# Patient Record
Sex: Male | Born: 1960 | Race: Black or African American | Hispanic: No | Smoking: Never smoker
Health system: Southern US, Community
[De-identification: ages and names within clinical notes are randomized; demographics above are authoritative.]

## PROBLEM LIST (undated history)

## (undated) HISTORY — PX: TESTICLE SURGERY: SHX794

## (undated) HISTORY — PX: HAND SURGERY: SHX662

---

## 2001-07-17 ENCOUNTER — Emergency Department (HOSPITAL_COMMUNITY): Admission: EM | Admit: 2001-07-17 | Discharge: 2001-07-17 | Payer: Self-pay | Admitting: Emergency Medicine

## 2001-07-28 ENCOUNTER — Emergency Department (HOSPITAL_COMMUNITY): Admission: EM | Admit: 2001-07-28 | Discharge: 2001-07-29 | Payer: Self-pay

## 2002-09-05 ENCOUNTER — Emergency Department (HOSPITAL_COMMUNITY): Admission: EM | Admit: 2002-09-05 | Discharge: 2002-09-05 | Payer: Self-pay | Admitting: Emergency Medicine

## 2002-09-05 ENCOUNTER — Encounter: Payer: Self-pay | Admitting: Emergency Medicine

## 2002-09-07 ENCOUNTER — Emergency Department (HOSPITAL_COMMUNITY): Admission: EM | Admit: 2002-09-07 | Discharge: 2002-09-07 | Payer: Self-pay | Admitting: Emergency Medicine

## 2002-09-14 ENCOUNTER — Ambulatory Visit (HOSPITAL_COMMUNITY): Admission: RE | Admit: 2002-09-14 | Discharge: 2002-09-14 | Payer: Self-pay | Admitting: Orthopaedic Surgery

## 2002-09-14 ENCOUNTER — Encounter: Payer: Self-pay | Admitting: Orthopaedic Surgery

## 2002-10-24 ENCOUNTER — Encounter: Payer: Self-pay | Admitting: Neurosurgery

## 2002-10-24 ENCOUNTER — Encounter: Admission: RE | Admit: 2002-10-24 | Discharge: 2002-10-24 | Payer: Self-pay | Admitting: Neurosurgery

## 2003-12-05 ENCOUNTER — Emergency Department (HOSPITAL_COMMUNITY): Admission: EM | Admit: 2003-12-05 | Discharge: 2003-12-06 | Payer: Self-pay | Admitting: Emergency Medicine

## 2003-12-09 ENCOUNTER — Emergency Department (HOSPITAL_COMMUNITY): Admission: EM | Admit: 2003-12-09 | Discharge: 2003-12-10 | Payer: Self-pay | Admitting: Emergency Medicine

## 2004-01-15 ENCOUNTER — Emergency Department (HOSPITAL_COMMUNITY): Admission: EM | Admit: 2004-01-15 | Discharge: 2004-01-15 | Payer: Self-pay | Admitting: Emergency Medicine

## 2004-01-21 ENCOUNTER — Emergency Department (HOSPITAL_COMMUNITY): Admission: EM | Admit: 2004-01-21 | Discharge: 2004-01-21 | Payer: Self-pay | Admitting: Emergency Medicine

## 2011-06-29 ENCOUNTER — Encounter (HOSPITAL_COMMUNITY): Payer: Self-pay

## 2011-06-29 ENCOUNTER — Emergency Department (HOSPITAL_COMMUNITY)
Admission: EM | Admit: 2011-06-29 | Discharge: 2011-06-29 | Disposition: A | Discharge status: Home / Self Care | Payer: BC Managed Care – PPO | Attending: Emergency Medicine | Admitting: Emergency Medicine

## 2011-06-29 DIAGNOSIS — R300 Dysuria: Secondary | ICD-10-CM | POA: Insufficient documentation

## 2011-06-29 LAB — URINALYSIS, ROUTINE W REFLEX MICROSCOPIC
Bilirubin Urine: NEGATIVE
Glucose, UA: NEGATIVE mg/dL
Ketones, ur: NEGATIVE mg/dL
Protein, ur: NEGATIVE mg/dL
Urobilinogen, UA: 0.2 mg/dL (ref 0.0–1.0)

## 2011-06-29 MED ORDER — METHOCARBAMOL 500 MG PO TABS
250.0000 mg | ORAL_TABLET | Freq: Once | ORAL | Status: AC
Start: 2011-06-29 — End: 2011-06-29
  Administered 2011-06-29: 250 mg via ORAL
  Filled 2011-06-29: qty 2

## 2011-06-29 MED ORDER — AZITHROMYCIN 250 MG PO TABS
1000.0000 mg | ORAL_TABLET | Freq: Once | ORAL | Status: AC
  Administered 2011-06-29: 1000 mg via ORAL
  Filled 2011-06-29: qty 4

## 2011-06-29 NOTE — Discharge Instructions (Signed)
Please increase fluids. Your results should be available in 3 to 4 days. Please refrain from sexual activity for the next 7 days.

## 2011-06-29 NOTE — ED Notes (Signed)
Pt reports had intercourse 9 days ago and reports since SUnday has noticed some "tingling" with urination and sweating in groin area.  Denies any rash

## 2011-07-14 NOTE — ED Provider Notes (Signed)
History     CSN: [id number]  Arrival date & time 06/29/11  1319   First MD Initiated Contact with Patient 06/29/11 1441      Chief Complaint  Patient presents with  . SEXUALLY TRANSMITTED DISEASE    (Consider location/radiation/quality/duration/timing/severity/associated sxs/prior treatment) Patient is a 51 y.o. male presenting with dysuria. The history is provided by the patient.  Dysuria  This is a new problem. The current episode started more than 1 week ago. The problem occurs intermittently. The problem has not changed since onset.The quality of the pain is described as burning (tingling). The pain is moderate. There has been no fever. He is sexually active. There is no history of pyelonephritis. Pertinent negatives include no chills, no nausea, no vomiting, no discharge, no frequency, no hematuria and no flank pain. He has tried nothing for the symptoms. His past medical history does not include kidney stones, single kidney, recurrent UTIs or catheterization.    History reviewed. No pertinent past medical history.  Past Surgical History  Procedure Date  . Hand surgery     No family history on file.  History  Substance Use Topics  . Smoking status: Never Smoker   . Smokeless tobacco: Not on file  . Alcohol Use: No      Review of Systems  Constitutional: Negative for chills and activity change.       All ROS Neg except as noted in HPI  HENT: Negative for nosebleeds and neck pain.   Eyes: Negative for photophobia and discharge.  Respiratory: Negative for cough, shortness of breath and wheezing.   Cardiovascular: Negative for chest pain and palpitations.  Gastrointestinal: Negative for nausea, vomiting, abdominal pain and blood in stool.  Genitourinary: Positive for dysuria. Negative for frequency, hematuria and flank pain.  Musculoskeletal: Negative for back pain and arthralgias.  Skin: Negative.   Neurological: Negative for dizziness, seizures and speech  difficulty.  Psychiatric/Behavioral: Negative for hallucinations and confusion.    Allergies  Review of patient's allergies indicates no known allergies.  Home Medications   Current Outpatient Rx  Name Route Sig Dispense Refill  . ADULT MULTIVITAMIN W/MINERALS CH Oral Take 1 tablet by mouth daily.      BP 151/94  Pulse 80  Temp(Src) 97.6 F (36.4 C) (Oral)  Resp 20  Ht 6' (1.829 m)  Wt 200 lb (90.719 kg)  BMI 27.12 kg/m2  SpO2 100%  Physical Exam  Nursing note and vitals reviewed. Constitutional: He is oriented to person, place, and time. He appears well-developed and well-nourished.  Non-toxic appearance.  HENT:  Head: Normocephalic.  Right Ear: Tympanic membrane and external ear normal.  Left Ear: Tympanic membrane and external ear normal.  Eyes: EOM and lids are normal. Pupils are equal, round, and reactive to light.  Neck: Normal range of motion. Neck supple. Carotid bruit is not present.  Cardiovascular: Normal rate, regular rhythm, normal heart sounds, intact distal pulses and normal pulses.   Pulmonary/Chest: Breath sounds normal. No respiratory distress.  Abdominal: Soft. Bowel sounds are normal. There is no tenderness. There is no guarding.  Genitourinary: Penis normal. No penile tenderness.       No rash of the penis. No significant discharge.  Musculoskeletal: Normal range of motion.  Lymphadenopathy:       Head (right side): No submandibular adenopathy present.       Head (left side): No submandibular adenopathy present.    He has no cervical adenopathy.  Neurological: He is alert and oriented to person,  place, and time. He has normal strength. No cranial nerve deficit or sensory deficit.  Skin: Skin is warm and dry.  Psychiatric: He has a normal mood and affect. His speech is normal.    ED Course  Procedures (including critical care time)   Labs Reviewed  URINALYSIS, ROUTINE W REFLEX MICROSCOPIC  GC/CHLAMYDIA PROBE AMP, GENITAL  LAB REPORT - SCANNED    No results found.   1. Dysuria       MDM  I have reviewed nursing notes, vital signs, and all appropriate lab and imaging results for this patient. Urethral culture obtained. Pt treated with zithromax and rocephin. Pt to have additional testing at [facility]       Kathie Dike, [place] 07/14/11 1055

## 2011-07-14 NOTE — ED Provider Notes (Signed)
Medical screening examination/treatment/procedure(s) were performed by non-physician practitioner and as supervising physician I was immediately available for consultation/collaboration.   Carleene Cooper III, MD 07/14/11 [phone]

## 2016-09-02 ENCOUNTER — Encounter (HOSPITAL_COMMUNITY): Payer: Self-pay | Admitting: Emergency Medicine

## 2016-09-02 ENCOUNTER — Emergency Department (HOSPITAL_COMMUNITY)
Admission: EM | Admit: 2016-09-02 | Discharge: 2016-09-02 | Disposition: A | Discharge status: Home / Self Care | Payer: BLUE CROSS/BLUE SHIELD | Attending: Emergency Medicine | Admitting: Emergency Medicine

## 2016-09-02 DIAGNOSIS — Z79899 Other long term (current) drug therapy: Secondary | ICD-10-CM | POA: Diagnosis not present

## 2016-09-02 DIAGNOSIS — R3129 Other microscopic hematuria: Secondary | ICD-10-CM

## 2016-09-02 DIAGNOSIS — R319 Hematuria, unspecified: Secondary | ICD-10-CM | POA: Diagnosis not present

## 2016-09-02 DIAGNOSIS — R300 Dysuria: Secondary | ICD-10-CM | POA: Diagnosis present

## 2016-09-02 DIAGNOSIS — Z791 Long term (current) use of non-steroidal anti-inflammatories (NSAID): Secondary | ICD-10-CM | POA: Diagnosis not present

## 2016-09-02 LAB — URINALYSIS, ROUTINE W REFLEX MICROSCOPIC
Bilirubin Urine: NEGATIVE
Glucose, UA: NEGATIVE mg/dL
Ketones, ur: NEGATIVE mg/dL
NITRITE: NEGATIVE
PH: 5 (ref 5.0–8.0)
PROTEIN: NEGATIVE mg/dL
SPECIFIC GRAVITY, URINE: 1.016 (ref 1.005–1.030)

## 2016-09-02 NOTE — ED Provider Notes (Signed)
[facility]PT Provider Note   CSN: [id number] Arrival date & time: 09/02/16  1935     History   Chief Complaint Chief Complaint  Patient presents with  . Dysuria    HPI Drew Wheeler is a 56 y.o. male.  HPI Patient presents after one day of dysuria, dark in urine. Patient is generally well, denies medical problems, states that he has not seen a doctor in a very long time. Since about 36 hours ago the patient has had mild discomfort at the very end of urination, as well as dark urine. There have been no concurrent changes including fever, chills, nausea, vomiting, diarrhea, abdominal pain, scrotal swelling, penis pain. Patient did have unprotected intercourse about 9 days ago, though he notes that he has a single partner, unchanged. Today, during the last urination the patient had no symptoms, notes that his urine has cleared.   History reviewed. No pertinent past medical history.  There are no active problems to display for this patient.   Past Surgical History:  Procedure Laterality Date  . HAND SURGERY    . TESTICLE SURGERY         Home Medications    Prior to Admission medications   Medication Sig Start Date End Date Taking? Authorizing Provider  Cyanocobalamin (B-12 PO) Take 1 tablet by mouth daily.   Yes [provider]  GINSENG PO Take 1 tablet by mouth daily.   Yes [provider]  ibuprofen (ADVIL,MOTRIN) 200 MG tablet Take 800 mg by mouth once as needed for headache.   Yes [provider]  pyridOXINE (VITAMIN B-6) 100 MG tablet Take 100 mg by mouth daily.   Yes [provider]  Thiamine HCl (B-1 PO) Take 1 tablet by mouth daily.   Yes [provider]    Family History History reviewed. No pertinent family history.  Social History Social History  Substance Use Topics  . Smoking status: Never Smoker  . Smokeless tobacco: Never Used  . Alcohol use No     Allergies   Patient has no known  allergies.   Review of Systems Review of Systems  Constitutional:       Per HPI, otherwise negative  HENT:       Per HPI, otherwise negative  Respiratory:       Per HPI, otherwise negative  Cardiovascular:       Per HPI, otherwise negative  Gastrointestinal: Negative for vomiting.  Endocrine:       Negative aside from HPI  Genitourinary:       Neg aside from HPI   Musculoskeletal:       Per HPI, otherwise negative  Skin: Negative.   Neurological: Negative for syncope.     Physical Exam Updated Vital Signs BP (!) 141/86 (BP Location: Right Arm)   Pulse (!) 108   Temp 98.6 F (37 C) (Oral)   Resp 20   Ht 5\' 11"  (1.803 m)   Wt 88.5 kg (195 lb)   SpO2 97%   BMI 27.20 kg/m   Physical Exam  Constitutional: He is oriented to person, place, and time. He appears well-developed. No distress.  HENT:  Head: Normocephalic and atraumatic.  Eyes: Conjunctivae and EOM are normal.  Cardiovascular: Normal rate and regular rhythm.   Pulmonary/Chest: Effort normal. No stridor. No respiratory distress.  Abdominal: He exhibits no distension and no mass. There is no tenderness. There is no guarding.  Musculoskeletal: He exhibits no edema.  Neurological: He is alert and  oriented to person, place, and time.  Skin: Skin is warm and dry.  Psychiatric: He has a normal mood and affect.  Nursing note and vitals reviewed.    ED Treatments / Results  Labs (all labs ordered are listed, but only abnormal results are displayed) Labs Reviewed  URINALYSIS, ROUTINE W REFLEX MICROSCOPIC - Abnormal; Notable for the following:       Result Value   APPearance HAZY (*)    Hgb urine dipstick LARGE (*)    Leukocytes, UA SMALL (*)    Bacteria, UA RARE (*)    Squamous Epithelial / LPF 0-5 (*)    All other components within normal limits     Procedures Procedures (including critical care time)   Initial Impression / Assessment and Plan / ED Course  I have reviewed the triage vital signs and  the nursing notes.  Pertinent labs & imaging results that were available during my care of the patient were reviewed by me and considered in my medical decision making (see chart for details).  Update: On repeat exam the patient is in no distress.  He has had additional episode of urination, again with no pain, no discoloration, and remains asymptomatic. Plan a lengthy conversation about findings, the possibility of urethritis versus kidney stone, absence of evidence for infection. With no ongoing complaints, no evidence for bacteremia, sepsis, no scrotal pain, there is low suspicion for bacteremia, sepsis, and after a conversation on return precautions, follow-up instructions, the patient was discharged in stable condition.  Final Clinical Impressions(s) / ED Diagnoses   Final diagnoses:  Dysuria  Hematuria, microscopic     Gerhard MunchLockwood, Joletta Manner, MD 09/02/16 2134

## 2016-09-02 NOTE — ED Triage Notes (Signed)
Pt states he is having sharp pains with urination

## 2016-09-02 NOTE — Discharge Instructions (Signed)
As discussed, today's evaluation has been generally reassuring. With your discomfort with urination, and evidence of blood in urine, there is suspicion that you passed a kidney stone. It is important that you monitor your condition carefully, stay well-hydrated, and do not hesitate to return here for concerning changes in your condition.

## 2016-10-26 DIAGNOSIS — R972 Elevated prostate specific antigen [PSA]: Secondary | ICD-10-CM | POA: Insufficient documentation

## 2016-12-12 ENCOUNTER — Emergency Department (HOSPITAL_COMMUNITY)
Admission: EM | Admit: 2016-12-12 | Discharge: 2016-12-12 | Disposition: A | Discharge status: Home / Self Care | Payer: BLUE CROSS/BLUE SHIELD | Attending: Emergency Medicine | Admitting: Emergency Medicine

## 2016-12-12 ENCOUNTER — Encounter (HOSPITAL_COMMUNITY): Payer: Self-pay

## 2016-12-12 ENCOUNTER — Emergency Department (HOSPITAL_COMMUNITY): Payer: BLUE CROSS/BLUE SHIELD

## 2016-12-12 DIAGNOSIS — S62521A Displaced fracture of distal phalanx of right thumb, initial encounter for closed fracture: Secondary | ICD-10-CM | POA: Diagnosis not present

## 2016-12-12 DIAGNOSIS — Y939 Activity, unspecified: Secondary | ICD-10-CM | POA: Insufficient documentation

## 2016-12-12 DIAGNOSIS — Y999 Unspecified external cause status: Secondary | ICD-10-CM | POA: Insufficient documentation

## 2016-12-12 DIAGNOSIS — S6991XA Unspecified injury of right wrist, hand and finger(s), initial encounter: Secondary | ICD-10-CM | POA: Diagnosis present

## 2016-12-12 DIAGNOSIS — Y929 Unspecified place or not applicable: Secondary | ICD-10-CM | POA: Diagnosis not present

## 2016-12-12 DIAGNOSIS — S62501A Fracture of unspecified phalanx of right thumb, initial encounter for closed fracture: Secondary | ICD-10-CM

## 2016-12-12 DIAGNOSIS — X58XXXA Exposure to other specified factors, initial encounter: Secondary | ICD-10-CM | POA: Diagnosis not present

## 2016-12-12 MED ORDER — TRAMADOL HCL 50 MG PO TABS: 50.0000 mg | ORAL_TABLET | Freq: Four times a day (QID) | ORAL | 0 refills | Status: DC | PRN

## 2016-12-12 NOTE — ED Provider Notes (Signed)
[facility] Provider Note   CSN: [id number] Arrival date & time: 12/12/16  1140     History   Chief Complaint Chief Complaint  Patient presents with  . Hand Pain    HPI Drew Wheeler is a 56 y.o. male.  Patient states he injured his right thumb couple weeks ago then reinjected a couple days ago.   The history is provided by the patient.  Hand Injury   The incident occurred 2 days ago. The incident occurred at home. The injury mechanism was a direct blow. Pain location: Right thumb. The quality of the pain is described as sharp. The pain is at a severity of 3/10. The pain is moderate. The pain has been intermittent since the incident. Pertinent negatives include no fever.    History reviewed. No pertinent past medical history.  There are no active problems to display for this patient.   Past Surgical History:  Procedure Laterality Date  . HAND SURGERY    . TESTICLE SURGERY         Home Medications    Prior to Admission medications   Medication Sig Start Date End Date Taking? Authorizing Provider  Cyanocobalamin (B-12 PO) Take 1 tablet by mouth daily.    [provider]  GINSENG PO Take 1 tablet by mouth daily.    [provider]  ibuprofen (ADVIL,MOTRIN) 200 MG tablet Take 800 mg by mouth once as needed for headache.    [provider]  pyridOXINE (VITAMIN B-6) 100 MG tablet Take 100 mg by mouth daily.    [provider]  Thiamine HCl (B-1 PO) Take 1 tablet by mouth daily.    [provider]    Family History No family history on file.  Social History Social History  Substance Use Topics  . Smoking status: Never Smoker  . Smokeless tobacco: Never Used  . Alcohol use No     Allergies   Patient has no known allergies.   Review of Systems Review of Systems  Constitutional: Negative for appetite change, fatigue and fever.  HENT: Negative for congestion, ear discharge and sinus pressure.   Eyes:  Negative for discharge.  Respiratory: Negative for cough.   Cardiovascular: Negative for chest pain.  Gastrointestinal: Negative for abdominal pain and diarrhea.  Genitourinary: Negative for frequency and hematuria.  Musculoskeletal: Negative for back pain.       Right thumb pain  Skin: Negative for rash.  Neurological: Negative for seizures and headaches.  Psychiatric/Behavioral: Negative for hallucinations.     Physical Exam Updated Vital Signs BP 130/78 (BP Location: Right Arm)   Pulse 73   Temp 98.3 F (36.8 C) (Oral)   Resp 16   Ht  (1.803 m)   Wt 89.4 kg (197 lb)   SpO2 96%   BMI 27.48 kg/m   Physical Exam  Constitutional: He is oriented to person, place, and time. He appears well-developed.  HENT:  Head: Normocephalic.  Eyes: Conjunctivae are normal.  Neck: No tracheal deviation present.  Cardiovascular:  No murmur heard. Musculoskeletal: Normal range of motion.  Tender swollen distal right thumb  Neurological: He is oriented to person, place, and time.  Skin: Skin is warm.  Psychiatric: He has a normal mood and affect.     ED Treatments / Results  Labs (all labs ordered are listed, but only abnormal results are displayed) Labs Reviewed - No data to [id number]  EKG  EKG Interpretation None       Radiology  Dg Finger Thumb Right  Result Date: 12/12/2016 CLINICAL DATA:  Blunt trauma to the first digit with pain and swelling, initial encounter EXAM: RIGHT THUMB 2+V COMPARISON:  None. FINDINGS: Minimally displaced fracture through the first distal phalanx is noted. No other fracture or soft tissue abnormality is noted. IMPRESSION: First distal phalangeal fracture. Electronically Signed   By: Alcide Clever M.D.   On: 12/12/2016 12:13    Procedures Procedures (including critical care time)  Medications Ordered in ED Medications - No data to display   Initial Impression / Assessment and Plan / ED Course  I have reviewed the triage vital signs and  the nursing notes.  Pertinent labs & imaging results that were available during my care of the patient were reviewed by me and considered in my medical decision making (see chart for details).   patient with fracture of distal phalanx of right thumb. Patient will be splinted and referred to orthopedics    Final Clinical Impressions(s) / ED Diagnoses   Final diagnoses:  Closed nondisplaced fracture of phalanx of right thumb, unspecified phalanx, initial encounter    New Prescriptions New Prescriptions   No medications on file     Bethann Berkshire, MD 12/12/16 1256

## 2016-12-12 NOTE — ED Triage Notes (Signed)
Reports of metal bar fell onto right thumb over 1 week ago. States he jammed finger again and now having increased pain and swelling.

## 2016-12-12 NOTE — Discharge Instructions (Signed)
Follow up with Dr. Romeo Apple in [place] this week.  Or follow up with dr. Jena Gauss in [place]

## 2016-12-12 NOTE — ED Notes (Signed)
Splint applied to right thumb

## 2017-06-03 ENCOUNTER — Other Ambulatory Visit: Payer: Self-pay

## 2017-06-03 ENCOUNTER — Emergency Department (HOSPITAL_COMMUNITY)
Admission: EM | Admit: 2017-06-03 | Discharge: 2017-06-03 | Disposition: A | Discharge status: Home / Self Care | Payer: BLUE CROSS/BLUE SHIELD | Attending: Emergency Medicine | Admitting: Emergency Medicine

## 2017-06-03 DIAGNOSIS — N3000 Acute cystitis without hematuria: Secondary | ICD-10-CM | POA: Diagnosis not present

## 2017-06-03 DIAGNOSIS — Z79899 Other long term (current) drug therapy: Secondary | ICD-10-CM | POA: Insufficient documentation

## 2017-06-03 DIAGNOSIS — R300 Dysuria: Secondary | ICD-10-CM | POA: Diagnosis present

## 2017-06-03 LAB — URINALYSIS, ROUTINE W REFLEX MICROSCOPIC
Bilirubin Urine: NEGATIVE
GLUCOSE, UA: NEGATIVE mg/dL
Ketones, ur: NEGATIVE mg/dL
Nitrite: NEGATIVE
Protein, ur: NEGATIVE mg/dL
SPECIFIC GRAVITY, URINE: 1.004 — AB (ref 1.005–1.030)
pH: 7 (ref 5.0–8.0)

## 2017-06-03 MED ORDER — CIPROFLOXACIN HCL 500 MG PO TABS: 500.0000 mg | ORAL_TABLET | Freq: Two times a day (BID) | ORAL | 0 refills | Status: DC

## 2017-06-03 MED ORDER — CIPROFLOXACIN HCL 250 MG PO TABS
500.0000 mg | ORAL_TABLET | Freq: Once | ORAL | Status: AC
  Administered 2017-06-03: 500 mg via ORAL
  Filled 2017-06-03: qty 2

## 2017-06-03 NOTE — ED Provider Notes (Signed)
[facility] EM[facility]GENCY DEPARTMENT Provider Note   CSN: [id number] Arrival date & time: 06/03/17  0033     History   Chief Complaint Chief Complaint  Patient presents with  . Testicle Pain    HPI Rosalio MacadamiaJohn E Warbington is a 57 y.o. male.  Patient is a 57 year old male with no significant past medical history.  He presents today for evaluation of burning on urination.  He states that he feels a slight burning when he begins his urine stream, however this improves as urination continues.  He denies any fevers or chills.  He denies to me he is experiencing any testicular pain or swelling.  He is sexually active with an on again, off again partner, most recently 4 days ago.  He denies that she is experiencing any similar issues.   The history is provided by the patient.  Dysuria   This is a new problem. The current episode started 2 days ago. The problem occurs every urination. The problem has not changed since onset.The quality of the pain is described as burning. The pain is mild. There has been no fever. Pertinent negatives include no chills, no discharge, no hematuria, no hesitancy, no urgency and no flank pain. He has tried nothing for the symptoms.    No past medical history on file.  There are no active problems to display for this patient.   Past Surgical History:  Procedure Laterality Date  . HAND SURGERY    . TESTICLE SURGERY         Home Medications    Prior to Admission medications   Medication Sig Start Date End Date Taking? Authorizing Provider  Cyanocobalamin (B-12 PO) Take 1 tablet by mouth daily.    [provider]  GINSENG PO Take 1 tablet by mouth daily.    [provider]  ibuprofen (ADVIL,MOTRIN) 200 MG tablet Take 800 mg by mouth once as needed for headache.    [provider]  pyridOXINE (VITAMIN B-6) 100 MG tablet Take 100 mg by mouth daily.    [provider]  Thiamine HCl (B-1 PO) Take 1 tablet by mouth daily.     [provider]  traMADol (ULTRAM) 50 MG tablet Take 1 tablet (50 mg total) by mouth every 6 (six) hours as needed. 12/12/16   Bethann BerkshireZammit, Joseph, MD    Family History No family history on file.  Social History Social History   Tobacco Use  . Smoking status: Never Smoker  . Smokeless tobacco: Never Used  Substance Use Topics  . Alcohol use: No  . Drug use: No     Allergies   Patient has no known allergies.   Review of Systems Review of Systems  Constitutional: Negative for chills.  Genitourinary: Positive for dysuria. Negative for flank pain, hematuria, hesitancy and urgency.  All other systems reviewed and are negative.    Physical Exam Updated Vital Signs BP (!) 143/106 (BP Location: Right Arm)   Pulse 70   Temp 97.8 F (36.6 C) (Oral)   Resp 18   Ht 5\' 11"  (1.803 m)   Wt 89.4 kg (197 lb)   SpO2 99%   BMI 27.48 kg/m   Physical Exam  Constitutional: He is oriented to person, place, and time. He appears well-developed and well-nourished. No distress.  HENT:  Head: Normocephalic and atraumatic.  Neck: Normal range of motion. Neck supple.  Abdominal: Soft. He exhibits no distension. There is no tenderness.  Genitourinary:  Genitourinary Comments: External genitalia is normal  in appearance.  There is no swelling.  Testicles are freely mobile.  There is no discharge.  Neurological: He is alert and oriented to person, place, and time.  Skin: Skin is warm and dry. He is not diaphoretic.  Nursing note and vitals reviewed.    ED Treatments / Results  Labs (all labs ordered are listed, but only abnormal results are displayed) Labs Reviewed  URINALYSIS, ROUTINE W REFLEX MICROSCOPIC - Abnormal; Notable for the following components:      Result Value   Color, Urine STRAW (*)    Specific Gravity, Urine 1.004 (*)    Hgb urine dipstick SMALL (*)    Leukocytes, UA MODERATE (*)    Bacteria, UA RARE (*)    Squamous Epithelial / LPF 0-5 (*)    All other  components within normal limits  GC/CHLAMYDIA PROBE AMP ([place]) NOT AT [facility]    EKG  EKG Interpretation None       Radiology No results found.  Procedures Procedures (including critical care time)  Medications Ordered in ED Medications  ciprofloxacin (CIPRO) tablet 500 mg (not administered)     Initial Impression / Assessment and Plan / ED Course  I have reviewed the triage vital signs and the nursing notes.  Pertinent labs & imaging results that were available during my care of the patient were reviewed by me and considered in my medical decision making (see chart for details).  UA suggestive of UTI. Will treat with Cipro, return prn.  A GC chlamydia test will be added to the urine, however I doubt this to be the case.  He is having no discharge and dysuria is minimal.  Will wait on cultures.  Final Clinical Impressions(s) / ED Diagnoses   Final diagnoses:  None    ED Discharge Orders    None       Geoffery Lyons, MD 06/03/17 [phone]

## 2017-06-03 NOTE — Discharge Instructions (Signed)
Cipro as prescribed.  We will call you if your cultures indicate you require further treatment or action.  Return to the emergency department if symptoms significantly worsen or change.

## 2017-06-03 NOTE — ED Triage Notes (Addendum)
Pt reports pain in both testicles since this afternoon, states also having some discomfort with urination, no noted swelling.  Sexually active with intermittent partner 4 days ago

## 2017-06-06 LAB — GC/CHLAMYDIA PROBE AMP (~~LOC~~) NOT AT ARMC
Chlamydia: NEGATIVE
Neisseria Gonorrhea: NEGATIVE

## 2017-10-11 ENCOUNTER — Emergency Department (HOSPITAL_COMMUNITY)
Admission: EM | Admit: 2017-10-11 | Discharge: 2017-10-11 | Disposition: A | Discharge status: Home / Self Care | Payer: BLUE CROSS/BLUE SHIELD | Attending: Emergency Medicine | Admitting: Emergency Medicine

## 2017-10-11 ENCOUNTER — Emergency Department (HOSPITAL_COMMUNITY): Payer: BLUE CROSS/BLUE SHIELD

## 2017-10-11 ENCOUNTER — Encounter (HOSPITAL_COMMUNITY): Payer: Self-pay | Admitting: Emergency Medicine

## 2017-10-11 ENCOUNTER — Other Ambulatory Visit: Payer: Self-pay

## 2017-10-11 DIAGNOSIS — L02512 Cutaneous abscess of left hand: Secondary | ICD-10-CM | POA: Diagnosis not present

## 2017-10-11 DIAGNOSIS — Z79899 Other long term (current) drug therapy: Secondary | ICD-10-CM | POA: Diagnosis not present

## 2017-10-11 DIAGNOSIS — M79642 Pain in left hand: Secondary | ICD-10-CM | POA: Diagnosis present

## 2017-10-11 MED ORDER — BUPIVACAINE HCL (PF) 0.5 % IJ SOLN
10.0000 mL | Freq: Once | INTRAMUSCULAR | Status: AC
  Administered 2017-10-11: 10 mL
  Filled 2017-10-11: qty 30

## 2017-10-11 MED ORDER — SULFAMETHOXAZOLE-TRIMETHOPRIM 800-160 MG PO TABS: 1.0000 | ORAL_TABLET | Freq: Two times a day (BID) | ORAL | 0 refills | Status: AC

## 2017-10-11 MED ORDER — HYDROCODONE-ACETAMINOPHEN 5-325 MG PO TABS: 1.0000 | ORAL_TABLET | ORAL | 0 refills | Status: DC | PRN

## 2017-10-11 MED ORDER — SULFAMETHOXAZOLE-TRIMETHOPRIM 800-160 MG PO TABS
1.0000 | ORAL_TABLET | Freq: Once | ORAL | Status: AC
  Administered 2017-10-11: 1 via ORAL
  Filled 2017-10-11: qty 1

## 2017-10-11 NOTE — ED Provider Notes (Signed)
[facility] EMERGENCY DEPARTMENT Provider Note   CSN: [id number] Arrival date & time: 10/11/17  1944     History   Chief Complaint Chief Complaint  Patient presents with  . Hand Pain    HPI Drew Wheeler is a 57 y.o. male.  Pt presents to the ED today with left hand pain and swelling.  The pt said he cut his left finger while at work about 4 days ago.  He cleaned it out and noticed some swelling yesterday.  He went to the ED in [place] and was put on Augmentin.  He has started taking that medication, but swelling and redness is worsening.     History reviewed. No pertinent past medical history.  There are no active problems to display for this patient.   Past Surgical History:  Procedure Laterality Date  . HAND SURGERY    . TESTICLE SURGERY          Home Medications    Prior to Admission medications   Medication Sig Start Date End Date Taking? Authorizing Provider  amoxicillin-clavulanate (AUGMENTIN) 875-125 MG tablet Take 1 tablet by mouth 2 (two) times daily. 10 day course starting on 10/11/2017   Yes [provider]  Cyanocobalamin (B-12 PO) Take 1 tablet by mouth daily.   Yes [provider]  GINSENG PO Take 1 tablet by mouth daily.   Yes [provider]  ibuprofen (ADVIL,MOTRIN) 200 MG tablet Take 800 mg by mouth daily as needed for headache or moderate pain.    Yes [provider]  pyridOXINE (VITAMIN B-6) 100 MG tablet Take 100 mg by mouth daily.   Yes [provider]  Thiamine HCl (B-1 PO) Take 1 tablet by mouth daily.   Yes [provider]  HYDROcodone-acetaminophen (NORCO/VICODIN) 5-325 MG tablet Take 1 tablet by mouth every 4 (four) hours as needed. 10/11/17   Jacalyn Lefevre, MD  sulfamethoxazole-trimethoprim (BACTRIM DS,SEPTRA DS) 800-160 MG tablet Take 1 tablet by mouth 2 (two) times daily for 7 days. 10/11/17 10/18/17  Jacalyn Lefevre, MD    Family History No family history on file.  Social  History Social History   Tobacco Use  . Smoking status: Never Smoker  . Smokeless tobacco: Never Used  Substance Use Topics  . Alcohol use: No  . Drug use: No     Allergies   Patient has no known allergies.   Review of Systems Review of Systems  Musculoskeletal:       Left hand pain  All other systems reviewed and are negative.    Physical Exam Updated Vital Signs BP (!) 155/94 (BP Location: Right Arm)   Pulse 85   Temp 97.8 F (36.6 C) (Oral)   Resp 16   Ht 5\' 11"  (1.803 m)   Wt 89.8 kg (198 lb)   SpO2 96%   BMI 27.62 kg/m   Physical Exam  Constitutional: He is oriented to person, place, and time. He appears well-developed and well-nourished.  HENT:  Head: Normocephalic and atraumatic.  Right Ear: External ear normal.  Left Ear: External ear normal.  Nose: Nose normal.  Mouth/Throat: Oropharynx is clear and moist.  Eyes: Pupils are equal, round, and reactive to light. Conjunctivae and EOM are normal.  Neck: Normal range of motion. Neck supple.  Cardiovascular: Normal rate, regular rhythm, normal heart sounds and intact distal pulses.  Pulmonary/Chest: Effort normal and breath sounds normal.  Abdominal: Soft. Bowel sounds are normal.  Musculoskeletal:  Left index finger with swelling and  abscess.  Cellulitis spreading down finger.  Neurological: He is alert and oriented to person, place, and time.  Skin: Skin is warm. Capillary refill takes less than 2 seconds.  Psychiatric: He has a normal mood and affect. His behavior is normal. Judgment and thought content normal.  Nursing note and vitals reviewed.    ED Treatments / Results  Labs (all labs ordered are listed, but only abnormal results are displayed) Labs Reviewed - No data to display  EKG None  Radiology Dg Hand Complete Left  Result Date: 10/11/2017 CLINICAL DATA:  Pain and swelling. Laceration index finger 4 days ago. EXAM: LEFT HAND - COMPLETE 3+ VIEW COMPARISON:  None. FINDINGS: Soft  tissue swelling of the index finger. No fracture or foreign body. Negative for arthropathy. IMPRESSION: Soft tissue swelling of the index finger. Negative for fracture or foreign body. Electronically Signed   By: Marlan Palauharles  Clark M.D.   On: 10/11/2017 20:27    Procedures .Marland Kitchen.Incision and Drainage Date/Time: 10/11/2017 9:30 PM Performed by: Jacalyn LefevreHaviland, Mikyah Alamo, MD Authorized by: Jacalyn LefevreHaviland, Gwenette Wellons, MD   Consent:    Consent obtained:  Verbal   Consent given by:  Patient   Risks discussed:  Bleeding, incomplete drainage and pain   Alternatives discussed:  No treatment Location:    Type:  Abscess   Size:  1   Location:  Upper extremity   Upper extremity location:  Finger   Finger location:  L index finger Pre-procedure details:    Skin preparation:  Chloraprep Anesthesia (see MAR for exact dosages):    Anesthesia method:  Nerve block   Block needle gauge:  25 G   Block anesthetic:  Bupivacaine 0.5% w/o epi   Block technique:  Digital   Block injection procedure:  Anatomic landmarks identified, introduced needle, incremental injection, anatomic landmarks palpated and negative aspiration for blood   Block outcome:  Anesthesia achieved Procedure type:    Complexity:  Simple Procedure details:    Needle aspiration: no     Incision types:  Cruciate   Scalpel blade:  11   Drainage:  Purulent   Drainage amount:  Moderate   Wound treatment:  Wound left open   Packing materials:  None Post-procedure details:    Patient tolerance of procedure:  Tolerated well, no immediate complications   (including critical care time)  Medications Ordered in ED Medications  bupivacaine (MARCAINE) 0.5 % injection 10 mL (10 mLs Infiltration Given by Other 10/11/17 2105)  sulfamethoxazole-trimethoprim (BACTRIM DS,SEPTRA DS) 800-160 MG per tablet 1 tablet (1 tablet Oral Given 10/11/17 2104)     Initial Impression / Assessment and Plan / ED Course  I have reviewed the triage vital signs and the nursing  notes.  Pertinent labs & imaging results that were available during my care of the patient were reviewed by me and considered in my medical decision making (see chart for details).     Pt likely has staph, so I changed abx to bactrim.  Pt told to return if sx worsen and to f/u with hand.  Final Clinical Impressions(s) / ED Diagnoses   Final diagnoses:  Abscess of finger of left hand    ED Discharge Orders        Ordered    sulfamethoxazole-trimethoprim (BACTRIM DS,SEPTRA DS) 800-160 MG tablet  2 times daily     10/11/17 2129    HYDROcodone-acetaminophen (NORCO/VICODIN) 5-325 MG tablet  Every 4 hours PRN     10/11/17 2129       Jacalyn LefevreHaviland, Gali Spinney,  MD 10/11/17 2131

## 2017-10-11 NOTE — ED Triage Notes (Signed)
Pt c/o left hand swelling since yesterday. Pt states he had a cut to the left index finger x 4 days ago.

## 2019-08-26 ENCOUNTER — Other Ambulatory Visit: Payer: Self-pay

## 2019-08-26 ENCOUNTER — Encounter (HOSPITAL_COMMUNITY): Payer: Self-pay | Admitting: Emergency Medicine

## 2019-08-26 ENCOUNTER — Emergency Department (HOSPITAL_COMMUNITY)
Admission: EM | Admit: 2019-08-26 | Discharge: 2019-08-26 | Disposition: A | Discharge status: Home / Self Care | Payer: BC Managed Care – PPO | Attending: Emergency Medicine | Admitting: Emergency Medicine

## 2019-08-26 DIAGNOSIS — R319 Hematuria, unspecified: Secondary | ICD-10-CM | POA: Insufficient documentation

## 2019-08-26 DIAGNOSIS — Z79899 Other long term (current) drug therapy: Secondary | ICD-10-CM | POA: Insufficient documentation

## 2019-08-26 DIAGNOSIS — Z202 Contact with and (suspected) exposure to infections with a predominantly sexual mode of transmission: Secondary | ICD-10-CM | POA: Diagnosis present

## 2019-08-26 LAB — URINALYSIS, ROUTINE W REFLEX MICROSCOPIC
Bacteria, UA: NONE SEEN
Bilirubin Urine: NEGATIVE
Glucose, UA: NEGATIVE mg/dL
Ketones, ur: NEGATIVE mg/dL
Leukocytes,Ua: NEGATIVE
Nitrite: NEGATIVE
Protein, ur: NEGATIVE mg/dL
Specific Gravity, Urine: 1.013 (ref 1.005–1.030)
pH: 6 (ref 5.0–8.0)

## 2019-08-26 NOTE — ED Triage Notes (Signed)
Patient treated with spouse for chlamydia on 4/7. Patient treated with Metronidazole. Patient states both abstained from intercourse until today. Patient states shortly after intercourse patient had white discharge with urination. Denies any pain or odor.

## 2019-08-26 NOTE — Discharge Instructions (Signed)
As discussed, you do not appear to have a urinary infection (remember that the gonorrhea and chlamydia cultures should result in 2 days, but I suspect these will be negative).  You do have blood in your urine that should be further investigated.  Call [facility] for an office appointment.  Additionally, I have made some recommendations for you to find a local primary MD.

## 2019-08-26 NOTE — ED Provider Notes (Signed)
[facility] EMERGENCY DEPARTMENT Provider Note   CSN: [id number] Arrival date & time: 08/26/19  1832     History Chief Complaint  Patient presents with  . Exposure to STD    Drew Wheeler is a 59 y.o. male presenting for evaluation of seeing a drop of blood at the end of his urine stream today along with a mild brief burning pain with urination x 2 today.  He denies back or flank pain.  He and his wife were both treated for trichomonas infection in April with metronidazole 2000 mg.  They have abstained for sex until today, after which he noted his symptoms.  He states she is symptom free, in fact had a full exam with std screening last week and had negative results. He denies multiple partners.  He is concerned the medication taken in April  wasn't effective.  He does state he saw a [facility]rologist at [facility] 2 years ago due to hematuria but has not followed up as he has been sx free until now.   The history is provided by the patient.       History reviewed. No pertinent past medical history.  There are no problems to display for this patient.   Past Surgical History:  Procedure Laterality Date  . HAND SURGERY    . TESTICLE SURGERY         History reviewed. No pertinent family history.  Social History   Tobacco Use  . Smoking status: Never Smoker  . Smokeless tobacco: Never Used  Substance Use Topics  . Alcohol use: No  . Drug use: No    Home Medications Prior to Admission medications   Medication Sig Start Date End Date Taking? Authorizing Provider  Cyanocobalamin (B-12 PO) Take 1 tablet by mouth daily.   Yes [provider]  GINSENG PO Take 1 tablet by mouth daily.   Yes [provider]  pyridOXINE (VITAMIN B-6) 100 MG tablet Take 100 mg by mouth daily.   Yes [provider]  Thiamine HCl (B-1 PO) Take 1 tablet by mouth daily.   Yes [provider]    Allergies    Patient has no known allergies.  Review of Systems   Review of  Systems  Constitutional: Negative for chills and fever.  HENT: Negative for congestion and sore throat.   Eyes: Negative.   Respiratory: Negative for chest tightness and shortness of breath.   Cardiovascular: Negative for chest pain.  Gastrointestinal: Negative for abdominal pain, nausea and vomiting.  Genitourinary: Positive for dysuria and hematuria. Negative for discharge and penile pain.  Musculoskeletal: Negative for arthralgias, joint swelling and neck pain.  Skin: Negative.  Negative for rash and wound.  Neurological: Negative for dizziness, weakness, light-headedness, numbness and headaches.  Psychiatric/Behavioral: Negative.     Physical Exam Updated Vital Signs BP (!) 142/89   Pulse 79   Temp 98.5 F (36.9 C) (Oral)   Resp 18   Ht 6' (1.829 m)   Wt 86.6 kg   SpO2 99%   BMI 25.90 kg/m   Physical Exam Vitals and nursing note reviewed.  Constitutional:      Appearance: He is well-developed.  HENT:     Head: Normocephalic and atraumatic.  Eyes:     Conjunctiva/sclera: Conjunctivae normal.  Cardiovascular:     Rate and Rhythm: Normal rate and regular rhythm.     Heart sounds: Normal heart sounds.  Pulmonary:     Effort: Pulmonary effort is normal.  Breath sounds: Normal breath sounds. No wheezing.  Abdominal:     General: Bowel sounds are normal.     Palpations: Abdomen is soft.     Tenderness: There is no abdominal tenderness.  Musculoskeletal:        General: Normal range of motion.     Cervical back: Normal range of motion.  Skin:    General: Skin is warm and dry.  Neurological:     General: No focal deficit present.     Mental Status: He is alert.     ED Results / Procedures / Treatments   Labs (all labs ordered are listed, but only abnormal results are displayed) Labs Reviewed  URINALYSIS, ROUTINE W REFLEX MICROSCOPIC - Abnormal; Notable for the following components:      Result Value   Hgb urine dipstick SMALL (*)    All other components  within normal limits  URINE CULTURE  GC/CHLAMYDIA PROBE AMP ([place]) NOT AT [facility]    EKG None  Radiology No results found.  Procedures Procedures (including critical care time)  Medications Ordered in ED Medications - No data to display  ED Course  I have reviewed the triage vital signs and the nursing notes.  Pertinent labs & imaging results that were available during my care of the patient were reviewed by me and considered in my medical decision making (see chart for details).    MDM Rules/Calculators/A&P                      Urine sample appears clear, no gross hematuria, microscopic however positive for small  hemoglobin.  Patient was given reassurance that there is no evidence of trichomonas on today's urine test, GC and Chlamydia cultures are pending.  Patient denies risk factors for STDs.  Urine culture also was ordered, no evidence that this finding indicates a UTI at this time.Marland Kitchen  He was encouraged to follow-up with urology for further evaluation of this microscopic hematuria.   Final Clinical Impression(s) / ED Diagnoses Final diagnoses:  Hematuria, unspecified type    Rx / DC Orders ED Discharge Orders    None       Victoriano Lain 08/26/19 2329    Bethann Berkshire, MD 08/27/19 1011

## 2019-10-17 ENCOUNTER — Ambulatory Visit (INDEPENDENT_AMBULATORY_CARE_PROVIDER_SITE_OTHER): Payer: BC Managed Care – PPO | Admitting: Urology

## 2019-10-17 ENCOUNTER — Other Ambulatory Visit: Payer: Self-pay

## 2019-10-17 ENCOUNTER — Encounter: Payer: Self-pay | Admitting: Urology

## 2019-10-17 VITALS — BP 143/83 | HR 76 | Temp 98.7°F | Ht 71.0 in | Wt 197.0 lb

## 2019-10-17 DIAGNOSIS — R310 Gross hematuria: Secondary | ICD-10-CM | POA: Diagnosis not present

## 2019-10-17 LAB — URINALYSIS, ROUTINE W REFLEX MICROSCOPIC
Bilirubin, UA: NEGATIVE
Glucose, UA: NEGATIVE
Ketones, UA: NEGATIVE
Leukocytes,UA: NEGATIVE
Nitrite, UA: NEGATIVE
Protein,UA: NEGATIVE
RBC, UA: NEGATIVE
Specific Gravity, UA: 1.02 (ref 1.005–1.030)
Urobilinogen, Ur: 0.2 mg/dL (ref 0.2–1.0)
pH, UA: 7 (ref 5.0–7.5)

## 2019-10-17 NOTE — Patient Instructions (Signed)
Hematuria, Adult Hematuria is blood in the urine. Blood may be visible in the urine, or it may be identified with a test. This condition can be caused by infections of the bladder, urethra, kidney, or prostate. Other possible causes include:  Kidney stones.  Cancer of the urinary tract.  Too much calcium in the urine.  Conditions that are passed from parent to child (inherited conditions).  Exercise that requires a lot of energy. Infections can usually be treated with medicine, and a kidney stone usually will pass through your urine. If neither of these is the cause of your hematuria, more tests may be needed to identify the cause of your symptoms. It is very important to tell your health care provider about any blood in your urine, even if it is painless or the blood stops without treatment. Blood in the urine, when it happens and then stops and then happens again, can be a symptom of a very serious condition, including cancer. There is no pain in the initial stages of many urinary cancers. Follow these instructions at home: Medicines  Take over-the-counter and prescription medicines only as told by your health care provider.  If you were prescribed an antibiotic medicine, take it as told by your health care provider. Do not stop taking the antibiotic even if you start to feel better. Eating and drinking  Drink enough fluid to keep your urine clear or pale yellow. It is recommended that you drink 3-4 quarts (2.8-3.8 L) a day. If you have been diagnosed with an infection, it is recommended that you drink cranberry juice in addition to large amounts of water.  Avoid caffeine, tea, and carbonated beverages. These tend to irritate the bladder.  Avoid alcohol because it may irritate the prostate (men). General instructions  If you have been diagnosed with a kidney stone, follow your health care provider's instructions about straining your urine to catch the stone.  Empty your bladder  often. Avoid holding urine for long periods of time.  If you are male: ? After a bowel movement, wipe from front to back and use each piece of toilet paper only once. ? Empty your bladder before and after sex.  Pay attention to any changes in your symptoms. Tell your health care provider about any changes or any new symptoms.  It is your responsibility to get your test results. Ask your health care provider, or the department performing the test, when your results will be ready.  Keep all follow-up visits as told by your health care provider. This is important. Contact a health care provider if:  You develop back pain.  You have a fever.  You have nausea or vomiting.  Your symptoms do not improve after 3 days.  Your symptoms get worse. Get help right away if:  You develop severe vomiting and are unable take medicine without vomiting.  You develop severe pain in your back or abdomen even though you are taking medicine.  You pass a large amount of blood in your urine.  You pass blood clots in your urine.  You feel very weak or like you might faint.  You faint. Summary  Hematuria is blood in the urine. It has many possible causes.  It is very important that you tell your health care provider about any blood in your urine, even if it is painless or the blood stops without treatment.  Take over-the-counter and prescription medicines only as told by your health care provider.  Drink enough fluid to keep   your urine clear or pale yellow. This information is not intended to replace advice given to you by your health care provider. Make sure you discuss any questions you have with your health care provider. Document Revised: 08/02/2018 Document Reviewed: 04/10/2016 Elsevier Patient Education  2020 Elsevier Inc.  

## 2019-10-17 NOTE — Progress Notes (Signed)
Urological Symptom Review  Patient is experiencing the following symptoms: Hard to postpone urination Sexually transmitted disease Erection problems (male only) Penile pain (male only)    Review of Systems  Gastrointestinal (upper)  : Indigestion/heartburn  Gastrointestinal (lower) : Negative for lower GI symptoms  Constitutional : Negative for symptoms  Skin: Negative for skin symptoms  Eyes: Negative for eye symptoms  Ear/Nose/Throat : Negative for Ear/Nose/Throat symptoms  Hematologic/Lymphatic: Negative for Hematologic/Lymphatic symptoms  Cardiovascular : Negative for cardiovascular symptoms  Respiratory : Negative for respiratory symptoms  Endocrine: Negative for endocrine symptoms  Musculoskeletal: Back pain Joint pain  Neurological: Negative for neurological symptoms  Psychologic: Negative for psychiatric symptoms

## 2019-10-17 NOTE — Progress Notes (Signed)
10/17/2019 2:10 PM   Drew Wheeler 1960/10/24 [id number]  Referring provider: No referring provider defined for this encounter.  Gross hematuria  HPI: Drew Wheeler is a 59yo here for evaluation of gross hematuria. He has had intermittent gross hematuria for 5 years. He was evaluated by a urologist at [facility] 3 years ago and was treated for BPH and incomplete emptying. He gets intermittent painless hematuria every 2-3 weeks.  He has worked at [employer] for 24 years. No tobacco abuse hx. No significant LUTS.  Good stream. NO nocturia. He has issues maintaining and erection for 1 year. Occasional issues getting the erection also. Good LIbido. He has never tried a PDE5 for an erection.    PMH: History reviewed. No pertinent past medical history.  Surgical History: Past Surgical History:  Procedure Laterality Date  . HAND SURGERY    . TESTICLE SURGERY      Home Medications:  Allergies as of 10/17/2019   No Known Allergies     Medication List       Accurate as of October 17, 2019  2:10 PM. If you have any questions, ask your nurse or doctor.        B-1 PO Take 1 tablet by mouth daily.   B-12 PO Take 1 tablet by mouth daily.   GINSENG PO Take 1 tablet by mouth daily.   pyridOXINE 100 MG tablet Commonly known as: VITAMIN B-6 Take 100 mg by mouth daily.       Allergies: No Known Allergies  Family History: History reviewed. No pertinent family history.  Social History:  reports that he has never smoked. He has never used smokeless tobacco. He reports that he does not drink alcohol and does not use drugs.  ROS: All other review of systems were reviewed and are negative except what is noted above in HPI  Physical Exam: BP (!) 143/83   Pulse 76   Temp 98.7 F (37.1 C)   Ht 5\' 11"  (1.803 m)   Wt 197 lb (89.4 kg)   BMI 27.48 kg/m   Constitutional:  Alert and oriented, No acute distress. HEENT: [place] AT, moist mucus membranes.  Trachea midline, no  masses. Cardiovascular: No clubbing, cyanosis, or edema. Respiratory: Normal respiratory effort, no increased work of breathing. GI: Abdomen is soft, nontender, nondistended, no abdominal masses GU: No CVA tenderness. Circumcised phallus. No masses/lesions on penis, testis, scrotum. Prostate 30g smooth no nodules no induration.  Lymph: No cervical or inguinal lymphadenopathy. Skin: No rashes, bruises or suspicious lesions. Neurologic: Grossly intact, no focal deficits, moving all 4 extremities. Psychiatric: Normal mood and affect.  Laboratory Data: No results found for: WBC, HGB, HCT, MCV, PLT  No results found for: CREATININE  No results found for: PSA  No results found for: TESTOSTERONE  No results found for: HGBA1C  Urinalysis    Component Value Date/Time   COLORURINE YELLOW 08/26/2019 2205   APPEARANCEUR CLEAR 08/26/2019 2205   LABSPEC 1.013 08/26/2019 2205   PHURINE 6.0 08/26/2019 2205   GLUCOSEU NEGATIVE 08/26/2019 2205   HGBUR SMALL (A) 08/26/2019 2205   BILIRUBINUR NEGATIVE 08/26/2019 2205   KETONESUR NEGATIVE 08/26/2019 2205   PROTEINUR NEGATIVE 08/26/2019 2205   UROBILINOGEN 0.2 06/29/2011 1332   NITRITE NEGATIVE 08/26/2019 2205   LEUKOCYTESUR NEGATIVE 08/26/2019 2205    Lab Results  Component Value Date   BACTERIA NONE SEEN 08/26/2019    Pertinent Imaging:  No results found for this or any previous visit.  No results  found for this or any previous visit.  No results found for this or any previous visit.  No results found for this or any previous visit.  No results found for this or any previous visit.  No results found for this or any previous visit.  No results found for this or any previous visit.  No results found for this or any previous visit.   Assessment & Plan:    1. Gross hematuria -BMP -CT hematuria -office cystoscopy - Urinalysis, Routine w reflex microscopic   No follow-ups on file.  Drew Aye, MD  [facility]  Urology [place]

## 2019-10-18 LAB — BASIC METABOLIC PANEL
BUN/Creatinine Ratio: 12 (ref 9–20)
BUN: 16 mg/dL (ref 6–24)
CO2: 26 mmol/L (ref 20–29)
Calcium: 10 mg/dL (ref 8.7–10.2)
Chloride: 103 mmol/L (ref 96–106)
Creatinine, Ser: 1.29 mg/dL — ABNORMAL HIGH (ref 0.76–1.27)
GFR calc Af Amer: 70 mL/min/{1.73_m2} (ref >59)
GFR calc non Af Amer: 61 mL/min/{1.73_m2} (ref >59)
Glucose: 87 mg/dL (ref 65–99)
Potassium: 5.4 mmol/L — ABNORMAL HIGH (ref 3.5–5.2)
Sodium: 141 mmol/L (ref 134–144)

## 2019-10-19 ENCOUNTER — Telehealth: Payer: Self-pay

## 2019-10-19 NOTE — Telephone Encounter (Signed)
-----   Message from Malen Gauze, MD sent at 10/19/2019  7:49 AM EDT ----- Creatinine mildly elevated. He needs to hydrate for his CT ----- Message ----- From: Ferdinand Lango, RN Sent: 10/18/2019   7:33 AM EDT To: Malen Gauze, MD  Please review

## 2019-10-19 NOTE — Telephone Encounter (Signed)
Left message to return call 

## 2019-10-25 NOTE — Telephone Encounter (Signed)
Pt returned call and made aware of creatinine level and to hydrate before CT.

## 2019-11-12 ENCOUNTER — Ambulatory Visit (HOSPITAL_COMMUNITY): Admission: RE | Admit: 2019-11-12 | Payer: BC Managed Care – PPO | Source: Ambulatory Visit

## 2019-11-20 ENCOUNTER — Other Ambulatory Visit: Payer: Self-pay

## 2019-11-20 ENCOUNTER — Ambulatory Visit (HOSPITAL_COMMUNITY)
Admission: RE | Admit: 2019-11-20 | Discharge: 2019-11-20 | Disposition: A | Discharge status: Home / Self Care | Payer: BC Managed Care – PPO | Source: Ambulatory Visit | Attending: Urology | Admitting: Urology

## 2019-11-20 DIAGNOSIS — R310 Gross hematuria: Secondary | ICD-10-CM | POA: Insufficient documentation

## 2019-11-20 LAB — POCT I-STAT CREATININE: Creatinine, Ser: 1.3 mg/dL — ABNORMAL HIGH (ref 0.61–1.24)

## 2019-11-20 MED ORDER — IOHEXOL 350 MG/ML SOLN: 125.0000 mL | Freq: Once | INTRAVENOUS | Status: DC | PRN

## 2019-11-20 MED ORDER — IOHEXOL 300 MG/ML  SOLN
125.0000 mL | Freq: Once | INTRAMUSCULAR | Status: AC | PRN
  Administered 2019-11-20: 125 mL via INTRAVENOUS

## 2019-11-27 ENCOUNTER — Ambulatory Visit: Payer: BC Managed Care – PPO | Admitting: Urology

## 2019-11-27 ENCOUNTER — Encounter: Payer: Self-pay | Admitting: Urology

## 2020-01-25 ENCOUNTER — Telehealth: Payer: Self-pay

## 2020-01-25 NOTE — Telephone Encounter (Signed)
-----   Message from Drew Gauze, MD sent at 01/22/2020  8:22 AM EDT ----- Patient has bilateral calculi and a thick bladder. He needs to see me for cystoscopy ----- Message ----- From: Ferdinand Lango, RN Sent: 11/21/2019  11:44 AM EDT To: Drew Gauze, MD  Please review

## 2020-01-25 NOTE — Telephone Encounter (Signed)
Called pt. Left message for tentative appt scheduled for 01/28/2020 for office visit with cysto.

## 2020-01-28 ENCOUNTER — Other Ambulatory Visit: Payer: BC Managed Care – PPO | Admitting: Urology

## 2020-02-04 NOTE — Telephone Encounter (Signed)
Wife returned called and made aware of CT results along with next OV appt. Wife voiced understanding.

## 2020-02-25 ENCOUNTER — Encounter (HOSPITAL_COMMUNITY): Payer: Self-pay | Admitting: Emergency Medicine

## 2020-02-25 ENCOUNTER — Other Ambulatory Visit: Payer: Self-pay

## 2020-02-25 ENCOUNTER — Emergency Department (HOSPITAL_COMMUNITY)
Admission: EM | Admit: 2020-02-25 | Discharge: 2020-02-25 | Disposition: A | Discharge status: Home / Self Care | Payer: BC Managed Care – PPO | Attending: Emergency Medicine | Admitting: Emergency Medicine

## 2020-02-25 DIAGNOSIS — R61 Generalized hyperhidrosis: Secondary | ICD-10-CM | POA: Diagnosis present

## 2020-02-25 DIAGNOSIS — Z9889 Other specified postprocedural states: Secondary | ICD-10-CM | POA: Diagnosis not present

## 2020-02-25 LAB — CBG MONITORING, ED: Glucose-Capillary: 120 mg/dL — ABNORMAL HIGH (ref 70–99)

## 2020-02-25 LAB — URINALYSIS, ROUTINE W REFLEX MICROSCOPIC
Bilirubin Urine: NEGATIVE
Glucose, UA: NEGATIVE mg/dL
Hgb urine dipstick: NEGATIVE
Ketones, ur: NEGATIVE mg/dL
Leukocytes,Ua: NEGATIVE
Nitrite: NEGATIVE
Protein, ur: NEGATIVE mg/dL
Specific Gravity, Urine: 1.012 (ref 1.005–1.030)
pH: 6 (ref 5.0–8.0)

## 2020-02-25 NOTE — Discharge Instructions (Addendum)
You could look in My Chart in the next 24 to 48 hours to get the results of your STD tests.  Your urinalysis today was normal and you do not have symptoms typical of an STD so I did not treat you today.  Your blood sugar was 120.  That is slightly high but not high enough for me to start treatment in the emergency department.  Your blood pressure was high tonight.  You should consider getting a primary care doctor to monitor your blood pressure and see if you need to be on blood pressure medication.

## 2020-02-25 NOTE — ED Provider Notes (Signed)
[facility] EMERGENCY DEPARTMENT Provider Note   CSN: [id number] Arrival date & time: 02/25/20  4098   Time seen 6:08 AM  History Chief Complaint  Patient presents with  . SEXUALLY TRANSMITTED DISEASE    Drew Wheeler is a 59 y.o. male.  HPI   Patient states Saturday, December 4 he started getting sweaty in his groin and it comes and goes.  He denies fever, rash, dysuria, frequency, or drip.  He denies history of diabetes.  He states he had unprotected sex which he has not used protection in years with the same sexual partner he has had for 15 years.  He thinks he may have an STD.  PCP Patient, No Pcp Per   History reviewed. No pertinent past medical history.  Patient Active Problem List   Diagnosis Date Noted  . Gross hematuria 10/17/2019    Past Surgical History:  Procedure Laterality Date  . HAND SURGERY    . TESTICLE SURGERY         History reviewed. No pertinent family history.  Social History   Tobacco Use  . Smoking status: Never Smoker  . Smokeless tobacco: Never Used  Vaping Use  . Vaping Use: Never used  Substance Use Topics  . Alcohol use: No  . Drug use: No    Home Medications Prior to Admission medications   Medication Sig Start Date End Date Taking? Authorizing Provider  Cyanocobalamin (B-12 PO) Take 1 tablet by mouth daily.    [provider]  GINSENG PO Take 1 tablet by mouth daily.    [provider]  pyridOXINE (VITAMIN B-6) 100 MG tablet Take 100 mg by mouth daily.    [provider]  Thiamine HCl (B-1 PO) Take 1 tablet by mouth daily.    [provider]    Allergies    Patient has no known allergies.  Review of Systems   Review of Systems  All other systems reviewed and are negative.   Physical Exam Updated Vital Signs BP (!) 170/109   Pulse 82   Temp 98 F (36.7 C) (Oral)   Resp 18   Ht 5\' 11"  (1.803 m)   Wt 89.4 kg   SpO2 98%   BMI 27.48 kg/m   Physical Exam Vitals and nursing  note reviewed. Exam conducted with a chaperone present.  Constitutional:      Appearance: Normal appearance. He is normal weight.  HENT:     Head: Normocephalic and atraumatic.  Eyes:     Extraocular Movements: Extraocular movements intact.     Conjunctiva/sclera: Conjunctivae normal.  Cardiovascular:     Rate and Rhythm: Normal rate.  Pulmonary:     Effort: Pulmonary effort is normal. No respiratory distress.  Genitourinary:    Penis: Normal.      Testes: Normal.     Comments: No rash was seen, no drip was seen Musculoskeletal:     Cervical back: Normal range of motion.  Skin:    General: Skin is warm and dry.  Neurological:     General: No focal deficit present.     Mental Status: He is alert and oriented to person, place, and time.     Cranial Nerves: No cranial nerve deficit.  Psychiatric:        Mood and Affect: Mood normal.        Behavior: Behavior normal.        Thought Content: Thought content normal.     ED Results / Procedures /  Treatments   Labs (all labs ordered are listed, but only abnormal results are displayed) Results for orders placed or performed during the hospital encounter of 02/25/20  Urinalysis, Routine w reflex microscopic PATH Cytology Urine  Result Value Ref Range   Color, Urine YELLOW YELLOW   APPearance CLEAR CLEAR   Specific Gravity, Urine 1.012 1.005 - 1.030   pH 6.0 5.0 - 8.0   Glucose, UA NEGATIVE NEGATIVE mg/dL   Hgb urine dipstick NEGATIVE NEGATIVE   Bilirubin Urine NEGATIVE NEGATIVE   Ketones, ur NEGATIVE NEGATIVE mg/dL   Protein, ur NEGATIVE NEGATIVE mg/dL   Nitrite NEGATIVE NEGATIVE   Leukocytes,Ua NEGATIVE NEGATIVE  CBG monitoring, ED  Result Value Ref Range   Glucose-Capillary 120 (H) 70 - 99 mg/dL   Laboratory interpretation all normal except elevated nonfasting glucose    EKG None  Radiology No results found.  Procedures Procedures (including critical care time)  Medications Ordered in ED Medications - No data  to display  ED Course  I have reviewed the triage vital signs and the nursing notes.  Pertinent labs & imaging results that were available during my care of the patient were reviewed by me and considered in my medical decision making (see chart for details).    MDM Rules/Calculators/A&P                          Patient wants to be tested for STDs.  He however does not want to be tested for syphilis or HIV.  He did provide a urine sample and urine for GC chlamydia was sent.  I also checked a CBG to make sure he did not have new onset diabetes.  If his UA is normal I am going to let him go home and wait his GC and Chlamydia results however if he has some pyuria I will go ahead and start treatment for urethritis although he has no symptoms suggestive of that.  CBG is 120.  Patient was discharged home.  His blood pressure was noted to be high, he was advised to get a primary care doctor to monitor his blood pressure and his blood sugar.    Final Clinical Impression(s) / ED Diagnoses Final diagnoses:  Sweating increase    Rx / DC Orders ED Discharge Orders    None     Plan discharge  Devoria Albe, MD, Concha Pyo, MD 02/25/20 [phone]

## 2020-02-25 NOTE — ED Triage Notes (Signed)
Pt here for STD check. Has had unprotected sex recently and states that now he is "getting hot and warm in his groin area". Denies any other symptoms.

## 2020-02-26 LAB — GC/CHLAMYDIA PROBE AMP (~~LOC~~) NOT AT ARMC
Chlamydia: NEGATIVE
Comment: NEGATIVE
Comment: NORMAL
Neisseria Gonorrhea: NEGATIVE

## 2020-03-12 ENCOUNTER — Other Ambulatory Visit: Payer: BC Managed Care – PPO | Admitting: Urology

## 2020-06-02 ENCOUNTER — Ambulatory Visit (INDEPENDENT_AMBULATORY_CARE_PROVIDER_SITE_OTHER): Payer: BC Managed Care – PPO | Admitting: Urology

## 2020-06-02 ENCOUNTER — Encounter: Payer: Self-pay | Admitting: Urology

## 2020-06-02 ENCOUNTER — Other Ambulatory Visit: Payer: Self-pay

## 2020-06-02 VITALS — BP 156/81 | HR 80 | Temp 98.8°F

## 2020-06-02 DIAGNOSIS — R310 Gross hematuria: Secondary | ICD-10-CM

## 2020-06-02 MED ORDER — CIPROFLOXACIN HCL 500 MG PO TABS: 500.0000 mg | ORAL_TABLET | Freq: Once | ORAL | Status: AC

## 2020-06-02 NOTE — Progress Notes (Signed)
06/02/2020 4:43 PM   Drew Wheeler 13-May-1960 [id number]  Referring provider: No referring provider defined for this encounter.  followup gross hematuria  HPI: [name] is a 60yo here for followup for gross hematuria. No hematuria episodes since last visit. CT hematuria protocol 11/2019 showed bilateral renal calculi and a thick walled bladder. He denies any LUTS. No dysuria. He is not on BPH therapy. No other complaints today.   PMH: No past medical history on file.  Surgical History: Past Surgical History:  Procedure Laterality Date  . HAND SURGERY    . TESTICLE SURGERY      Home Medications:  Allergies as of 06/02/2020   No Known Allergies     Medication List       Accurate as of June 02, 2020  4:43 PM. If you have any questions, ask your nurse or doctor.        B-1 PO Take 1 tablet by mouth daily.   B-12 PO Take 1 tablet by mouth daily.   GINSENG PO Take 1 tablet by mouth daily.   ibuprofen 400 MG tablet Commonly known as: ADVIL Take by mouth.   pyridOXINE 100 MG tablet Commonly known as: VITAMIN B-6 Take 100 mg by mouth daily.       Allergies: No Known Allergies  Family History: No family history on file.  Social History:  reports that he has never smoked. He has never used smokeless tobacco. He reports that he does not drink alcohol and does not use drugs.  ROS: All other review of systems were reviewed and are negative except what is noted above in HPI  Physical Exam: BP (!) 156/81   Pulse 80   Temp 98.8 F (37.1 C)   Constitutional:  Alert and oriented, No acute distress. HEENT: [place] AT, moist mucus membranes.  Trachea midline, no masses. Cardiovascular: No clubbing, cyanosis, or edema. Respiratory: Normal respiratory effort, no increased work of breathing. GI: Abdomen is soft, nontender, nondistended, no abdominal masses GU: No CVA tenderness.  Lymph: No cervical or inguinal lymphadenopathy. Skin: No rashes, bruises or  suspicious lesions. Neurologic: Grossly intact, no focal deficits, moving all 4 extremities. Psychiatric: Normal mood and affect.  Laboratory Data: No results found for: WBC, HGB, HCT, MCV, PLT  Lab Results  Component Value Date   CREATININE 1.30 (H) 11/20/2019    No results found for: PSA  No results found for: TESTOSTERONE  No results found for: HGBA1C  Urinalysis    Component Value Date/Time   COLORURINE YELLOW 02/25/2020 0613   APPEARANCEUR CLEAR 02/25/2020 0613   APPEARANCEUR Clear 10/17/2019 1405   LABSPEC 1.012 02/25/2020 0613   PHURINE 6.0 02/25/2020 0613   GLUCOSEU NEGATIVE 02/25/2020 0613   HGBUR NEGATIVE 02/25/2020 0613   BILIRUBINUR NEGATIVE 02/25/2020 0613   BILIRUBINUR Negative 10/17/2019 1405   KETONESUR NEGATIVE 02/25/2020 0613   PROTEINUR NEGATIVE 02/25/2020 0613   UROBILINOGEN 0.2 06/29/2011 1332   NITRITE NEGATIVE 02/25/2020 0613   LEUKOCYTESUR NEGATIVE 02/25/2020 6948    Lab Results  Component Value Date   LABMICR Comment 10/17/2019   BACTERIA NONE SEEN 08/26/2019    Pertinent Imaging: CT hematuria 11/2019: Images reviewed and discussed with the patient No results found for this or any previous visit.  No results found for this or any previous visit.  No results found for this or any previous visit.  No results found for this or any previous visit.  No results found for this or any previous visit.  No  results found for this or any previous visit.  Results for orders placed during the hospital encounter of 11/20/19  CT HEMATURIA WORKUP  Narrative CLINICAL DATA:  Gross hematuria  EXAM: CT ABDOMEN AND PELVIS WITHOUT AND WITH CONTRAST  TECHNIQUE: Multidetector CT imaging of the abdomen and pelvis was performed following the standard protocol before and following the bolus administration of intravenous contrast.  CONTRAST:  OMNIPAQUE IOHEXOL 300 MG/ML  SOLN  COMPARISON:  None.  FINDINGS: Lower chest: 4 mm calcified  granuloma in the lingula (series 5/image 16). Mild coronary atherosclerosis in the LAD and right coronary artery.  Hepatobiliary: 4 mm cyst in the posterior right hepatic lobe (series 8/image 18).  Gallbladder is unremarkable. No intrahepatic or extrahepatic ductal dilatation.  Pancreas: Within normal limits.  Spleen: Within normal limits.  Adrenals/Urinary Tract: Adrenal glands are within normal limits.  Two punctate nonobstructing left upper pole renal calculi (coronal image 63).  Two 2 mm nonobstructing right upper pole renal calculi (coronal images 61 and 63). Additional punctate nonobstructing right lower pole renal calculus (coronal image 57). No ureteral or bladder calculi. No hydronephrosis.  Small left renal cysts, measuring up to 14 mm in the posterior interpolar left kidney (series 8/image 33). No enhancing renal lesions.  On delayed imaging, there are no filling defects in the bilateral opacified proximal collecting systems, ureters, or bladder.  Thick-walled bladder, although underdistended.  Stomach/Bowel: Stomach is within normal limits.  No evidence of bowel obstruction.  Normal appendix (series 8/image 56).  Vascular/Lymphatic: No evidence of abdominal aortic aneurysm.  Atherosclerotic calcifications of the abdominal aorta and branch vessels.  No suspicious abdominopelvic lymphadenopathy.  Reproductive: Prostate is unremarkable.  Other: No abdominopelvic ascites.  Musculoskeletal: Mild degenerative changes of the visualized thoracolumbar spine.  IMPRESSION: Small bilateral nonobstructing renal calculi measuring up to 2 mm. No ureteral or bladder calculi. No hydronephrosis.  Mildly thick-walled bladder, although underdistended. In the setting of hematuria, consider cystoscopy if not already performed.  Additional ancillary findings as above.   Electronically Signed By: Charline Bills M.D. On: 11/21/2019 10:26  No results found for  this or any previous visit.   Assessment & Plan:    1. Gross hematuria -RTC 6 with UA. Patient defers cystoscopy. - Urinalysis, Routine w reflex microscopic - Cytology, urine   No follow-ups on file.  Wilkie Aye, MD  [facility]

## 2020-06-02 NOTE — Progress Notes (Signed)
Urological Symptom Review  Patient is experiencing the following symptoms: Get up at night to urinate Blood in urine   Review of Systems  Gastrointestinal (upper)  : Negative for upper GI symptoms  Gastrointestinal (lower) : Negative for lower GI symptoms  Constitutional : Negative for symptoms  Skin: Negative for skin symptoms  Eyes: Negative for eye symptoms  Ear/Nose/Throat : Negative for Ear/Nose/Throat symptoms  Hematologic/Lymphatic: Negative for Hematologic/Lymphatic symptoms  Cardiovascular : Negative for cardiovascular symptoms  Respiratory : Negative for respiratory symptoms  Endocrine: Negative for endocrine symptoms  Musculoskeletal: Back /joint pain  Neurological: Negative for neurological symptoms  Psychologic: Negative for psychiatric symptoms

## 2020-06-02 NOTE — Patient Instructions (Signed)

## 2020-06-03 LAB — URINALYSIS, ROUTINE W REFLEX MICROSCOPIC
Bilirubin, UA: NEGATIVE
Glucose, UA: NEGATIVE
Ketones, UA: NEGATIVE
Leukocytes,UA: NEGATIVE
Nitrite, UA: NEGATIVE
Protein,UA: NEGATIVE
Specific Gravity, UA: 1.015 (ref 1.005–1.030)
Urobilinogen, Ur: 0.2 mg/dL (ref 0.2–1.0)
pH, UA: 6 (ref 5.0–7.5)

## 2020-06-03 LAB — CYTOLOGY, URINE

## 2020-06-03 LAB — MICROSCOPIC EXAMINATION
Bacteria, UA: NONE SEEN
Epithelial Cells (non renal): NONE SEEN /hpf (ref 0–10)
RBC, Urine: NONE SEEN /hpf (ref 0–2)
Renal Epithel, UA: NONE SEEN /hpf
WBC, UA: NONE SEEN /hpf (ref 0–5)

## 2020-06-09 NOTE — Progress Notes (Signed)
Letter sent via mail 

## 2020-06-11 ENCOUNTER — Telehealth: Payer: Self-pay | Admitting: Urology

## 2020-06-11 DIAGNOSIS — E291 Testicular hypofunction: Secondary | ICD-10-CM

## 2020-06-11 NOTE — Telephone Encounter (Signed)
Pt stated that his testosterone level is low and wanted to have his testosterone labs done.  I didn't see an order in his chart to have this done. Please advise.

## 2020-06-13 ENCOUNTER — Telehealth: Payer: Self-pay | Admitting: Urology

## 2020-06-13 NOTE — Telephone Encounter (Signed)
Patient's wife called about testosterone labs for the patient.  Lab appointment scheduled for Monday, 3/28 at 8:30am.  Patient's wife also inquired about a prescription for Cialis or Viagra for the patient.  I did not see a discussion in his most recent office note about any issues with ED.  Should the patient make another appointment or wait for testosterone results?  Please advise.

## 2020-06-16 ENCOUNTER — Other Ambulatory Visit: Payer: BC Managed Care – PPO

## 2020-06-16 ENCOUNTER — Other Ambulatory Visit: Payer: Self-pay

## 2020-06-18 LAB — TESTOSTERONE,FREE AND TOTAL
Testosterone, Free: 6.5 pg/mL — ABNORMAL LOW (ref 7.2–24.0)
Testosterone: 332 ng/dL (ref 264–916)

## 2020-06-25 ENCOUNTER — Telehealth: Payer: Self-pay

## 2020-06-25 NOTE — Telephone Encounter (Signed)
Left message on pts home number to call back. No voicemail on cell number.

## 2020-06-25 NOTE — Telephone Encounter (Signed)
-----   Message from Malen Gauze, MD sent at 06/24/2020 10:31 AM EDT ----- Labs normal ----- Message ----- From: Dalia Heading, LPN Sent: 05/14/3610   9:39 AM EDT To: Malen Gauze, MD  Pls review.

## 2020-06-26 ENCOUNTER — Telehealth: Payer: Self-pay

## 2020-06-26 NOTE — Telephone Encounter (Signed)
Attempted to call pt back. No answer. Left message on home phone. Unable to leave message on mobile.

## 2020-06-26 NOTE — Telephone Encounter (Signed)
-----   Message from Patrick L McKenzie, MD sent at 06/24/2020 10:31 AM EDT ----- Labs normal ----- Message ----- From: Francella Barnett, LPN Sent: 06/18/2020   9:39 AM EDT To: Patrick L McKenzie, MD  Pls review.  

## 2020-06-27 ENCOUNTER — Telehealth: Payer: Self-pay

## 2020-06-27 NOTE — Telephone Encounter (Signed)
Patient calling back a 2nd time to get recent lab results.  Please call pt at:  [phone]  Thanks, Rosey Bath

## 2020-06-27 NOTE — Telephone Encounter (Signed)
Pt was notified labs results were normal per Dr. Ronne Binning.

## 2020-06-30 ENCOUNTER — Telehealth: Payer: Self-pay | Admitting: Urology

## 2020-06-30 NOTE — Telephone Encounter (Signed)
I called pt and left message that a message was sent to Dr. Ronne Binning about his request for Viagra.

## 2020-06-30 NOTE — Telephone Encounter (Signed)
Pt called on 06/27/20 and lmom wanting to know if he could get a prescription for Viagra.

## 2020-07-01 NOTE — Telephone Encounter (Signed)
I don't see that he has ever received Viagra from [place], so he would need an OV to discuss the med.   If he has had either of the meds before, he could let us know the dose and I would consider refilling.

## 2020-07-02 ENCOUNTER — Telehealth: Payer: Self-pay

## 2020-07-02 NOTE — Telephone Encounter (Signed)
Patient's wife called to ask if pt could receive Viagra.  Says it has been a month since she has requested medication for pt with no medication. Wanting a call back asap.  Thanks, Rosey Bath

## 2020-07-03 NOTE — Telephone Encounter (Signed)
Please see other note

## 2020-07-03 NOTE — Telephone Encounter (Signed)
Dr Annabell Howells looked at this pts wifes request for pt to get Viagra or an ED med because Dr. Ronne Binning is on vacation. He said he has never had any that he can see so they have to make an appointment. Wife was notified and appointment made.

## 2020-08-01 ENCOUNTER — Encounter: Payer: Self-pay | Admitting: Urology

## 2020-08-01 ENCOUNTER — Other Ambulatory Visit: Payer: Self-pay

## 2020-08-01 ENCOUNTER — Ambulatory Visit (INDEPENDENT_AMBULATORY_CARE_PROVIDER_SITE_OTHER): Payer: BC Managed Care – PPO | Admitting: Urology

## 2020-08-01 VITALS — BP 149/86 | HR 76 | Temp 98.5°F | Ht 71.0 in | Wt 210.4 lb

## 2020-08-01 DIAGNOSIS — M25511 Pain in right shoulder: Secondary | ICD-10-CM

## 2020-08-01 DIAGNOSIS — N529 Male erectile dysfunction, unspecified: Secondary | ICD-10-CM

## 2020-08-01 MED ORDER — TADALAFIL 20 MG PO TABS: 20.0000 mg | ORAL_TABLET | ORAL | 5 refills | Status: AC | PRN

## 2020-08-01 NOTE — Progress Notes (Signed)
08/01/2020 3:37 PM   Drew Wheeler 1960/03/25 [id number]  Referring provider: No referring provider defined for this encounter.  Erectile dysfunction  HPI: Drew Wheeler is 60yo here for evaluation of erectile dysfunction. Testosterone normal. He is able to get a firm erection but he cannot maintain an erection. Good libido. He has never tried a PDE5. Good exercise tolerance.    PMH: No past medical history on file.  Surgical History: Past Surgical History:  Procedure Laterality Date  . HAND SURGERY    . TESTICLE SURGERY      Home Medications:  Allergies as of 08/01/2020   No Known Allergies     Medication List       Accurate as of Aug 01, 2020  3:37 PM. If you have any questions, ask your nurse or doctor.        B-1 PO Take 1 tablet by mouth daily.   B-12 PO Take 1 tablet by mouth daily.   GINSENG PO Take 1 tablet by mouth daily.   ibuprofen 400 MG tablet Commonly known as: ADVIL Take by mouth.   pyridOXINE 100 MG tablet Commonly known as: VITAMIN B-6 Take 100 mg by mouth daily.       Allergies: No Known Allergies  Family History: No family history on file.  Social History:  reports that he has never smoked. He has never used smokeless tobacco. He reports that he does not drink alcohol and does not use drugs.  ROS: All other review of systems were reviewed and are negative except what is noted above in HPI  Physical Exam: BP (!) 149/86   Pulse 76   Temp 98.5 F (36.9 C) (Oral)   Ht 5\' 11"  (1.803 m)   Wt 210 lb 6.4 oz (95.4 kg)   BMI 29.34 kg/m   Constitutional:  Alert and oriented, No acute distress. HEENT: [place] AT, moist mucus membranes.  Trachea midline, no masses. Cardiovascular: No clubbing, cyanosis, or edema. Respiratory: Normal respiratory effort, no increased work of breathing. GI: Abdomen is soft, nontender, nondistended, no abdominal masses GU: No CVA tenderness.  Lymph: No cervical or inguinal lymphadenopathy. Skin: No rashes,  bruises or suspicious lesions. Neurologic: Grossly intact, no focal deficits, moving all 4 extremities. Psychiatric: Normal mood and affect.  Laboratory Data: No results found for: WBC, HGB, HCT, MCV, PLT  Lab Results  Component Value Date   CREATININE 1.30 (H) 11/20/2019    No results found for: PSA  Lab Results  Component Value Date   TESTOSTERONE 332 06/16/2020    No results found for: HGBA1C  Urinalysis    Component Value Date/Time   COLORURINE YELLOW 02/25/2020 0613   APPEARANCEUR Clear 06/02/2020 1632   LABSPEC 1.012 02/25/2020 0613   PHURINE 6.0 02/25/2020 0613   GLUCOSEU Negative 06/02/2020 1632   HGBUR NEGATIVE 02/25/2020 0613   BILIRUBINUR Negative 06/02/2020 1632   KETONESUR NEGATIVE 02/25/2020 0613   PROTEINUR Negative 06/02/2020 1632   PROTEINUR NEGATIVE 02/25/2020 0613   UROBILINOGEN 0.2 06/29/2011 1332   NITRITE Negative 06/02/2020 1632   NITRITE NEGATIVE 02/25/2020 0613   LEUKOCYTESUR Negative 06/02/2020 1632   LEUKOCYTESUR NEGATIVE 02/25/2020 0613    Lab Results  Component Value Date   LABMICR See below: 06/02/2020   WBCUA None seen 06/02/2020   LABEPIT None seen 06/02/2020   BACTERIA None seen 06/02/2020    Pertinent Imaging:  No results found for this or any previous visit.  No results found for this or any previous visit.  No results found for this or any previous visit.  No results found for this or any previous visit.  No results found for this or any previous visit.  No results found for this or any previous visit.  Results for orders placed during the hospital encounter of 11/20/19  CT HEMATURIA WORKUP  Narrative CLINICAL DATA:  Gross hematuria  EXAM: CT ABDOMEN AND PELVIS WITHOUT AND WITH CONTRAST  TECHNIQUE: Multidetector CT imaging of the abdomen and pelvis was performed following the standard protocol before and following the bolus administration of intravenous contrast.  CONTRAST:  OMNIPAQUE IOHEXOL 300  MG/ML  SOLN  COMPARISON:  None.  FINDINGS: Lower chest: 4 mm calcified granuloma in the lingula (series 5/image 16). Mild coronary atherosclerosis in the LAD and right coronary artery.  Hepatobiliary: 4 mm cyst in the posterior right hepatic lobe (series 8/image 18).  Gallbladder is unremarkable. No intrahepatic or extrahepatic ductal dilatation.  Pancreas: Within normal limits.  Spleen: Within normal limits.  Adrenals/Urinary Tract: Adrenal glands are within normal limits.  Two punctate nonobstructing left upper pole renal calculi (coronal image 63).  Two 2 mm nonobstructing right upper pole renal calculi (coronal images 61 and 63). Additional punctate nonobstructing right lower pole renal calculus (coronal image 57). No ureteral or bladder calculi. No hydronephrosis.  Small left renal cysts, measuring up to 14 mm in the posterior interpolar left kidney (series 8/image 33). No enhancing renal lesions.  On delayed imaging, there are no filling defects in the bilateral opacified proximal collecting systems, ureters, or bladder.  Thick-walled bladder, although underdistended.  Stomach/Bowel: Stomach is within normal limits.  No evidence of bowel obstruction.  Normal appendix (series 8/image 56).  Vascular/Lymphatic: No evidence of abdominal aortic aneurysm.  Atherosclerotic calcifications of the abdominal aorta and branch vessels.  No suspicious abdominopelvic lymphadenopathy.  Reproductive: Prostate is unremarkable.  Other: No abdominopelvic ascites.  Musculoskeletal: Mild degenerative changes of the visualized thoracolumbar spine.  IMPRESSION: Small bilateral nonobstructing renal calculi measuring up to 2 mm. No ureteral or bladder calculi. No hydronephrosis.  Mildly thick-walled bladder, although underdistended. In the setting of hematuria, consider cystoscopy if not already performed.  Additional ancillary findings as above.   Electronically  Signed By: Charline Bills M.D. On: 11/21/2019 10:26  No results found for this or any previous visit.   Assessment & Plan:    1. Erectile dysfunction, unspecified erectile dysfunction type -We will try tadalafil 20mg  prn.    No follow-ups on file.  , MD  [facility] [place]

## 2020-08-01 NOTE — Patient Instructions (Signed)
Erectile Dysfunction Erectile dysfunction (ED) is the inability to get or keep an erection in order to have sexual intercourse. ED is considered a symptom of an underlying disorder and not considered a disease. Erectile dysfunction may include:  Inability to get an erection.  Lack of enough hardness of the erection to allow penetration.  Loss of the erection before sex is finished. What are the causes? This condition may be caused by:  Certain medicines, such as: ? Pain relievers. ? Antihistamines. ? Antidepressants. ? Blood pressure medicines. ? Water pills (diuretics). ? Ulcer medicines. ? Muscle relaxants. ? Drugs.  Excessive drinking.  Psychological causes, such as: ? Anxiety. ? Depression. ? Sadness. ? Exhaustion. ? Performance fear. ? Stress.  Physical causes, such as: ? Artery problems. This may include diabetes, smoking, liver disease, or atherosclerosis. ? High blood pressure. ? Hormonal problems, such as low testosterone. ? Obesity. ? Nerve problems. This may include back or pelvic injuries, diabetes mellitus, multiple sclerosis, or Parkinson's disease. What are the signs or symptoms? Symptoms of this condition include:  Inability to get an erection.  Lack of enough hardness of the erection to allow penetration.  Loss of the erection before sex is finished.  Normal erections at some times, but with frequent unsatisfactory episodes.  Low sexual satisfaction in either partner due to erection problems.  A curved penis occurring with erection. The curve may cause pain or the penis may be too curved to allow for intercourse.  Never having nighttime erections. How is this diagnosed? This condition is often diagnosed by:  Performing a physical exam to find other diseases or specific problems with the penis.  Asking you detailed questions about the problem.  Performing blood tests to check for diabetes mellitus or to measure hormone levels.  Performing  other tests to check for underlying health conditions.  Performing an ultrasound exam to check for scarring.  Performing a test to check blood flow to the penis.  Doing a sleep study at home to measure nighttime erections. How is this treated? This condition may be treated by:  Medicine taken by mouth to help you achieve an erection (oral medicine).  Hormone replacement therapy to replace low testosterone levels.  Medicine that is injected into the penis. Your health care provider may instruct you how to give yourself these injections at home.  Vacuum pump. This is a pump with a ring on it. The pump and ring are placed on the penis and used to create pressure that helps the penis become erect.  Penile implant surgery. In this procedure, you may receive: ? An inflatable implant. This consists of cylinders, a pump, and a reservoir. The cylinders can be inflated with a fluid that helps to create an erection, and they can be deflated after intercourse. ? A semi-rigid implant. This consists of two silicone rubber rods. The rods provide some rigidity. They are also flexible, so the penis can both curve downward in its normal position and become straight for sexual intercourse.  Blood vessel surgery, to improve blood flow to the penis. During this procedure, a blood vessel from a different part of the body is placed into the penis to allow blood to flow around (bypass) damaged or blocked blood vessels.  Lifestyle changes, such as exercising more, losing weight, and quitting smoking. Follow these instructions at home: Medicines  Take over-the-counter and prescription medicines only as told by your health care provider. Do not increase the dosage without first discussing it with your health care   provider.  If you are using self-injections, perform injections as directed by your health care provider. Make sure to avoid any veins that are on the surface of the penis. After giving an injection,  apply pressure to the injection site for 5 minutes.   General instructions  Exercise regularly, as directed by your health care provider. Work with your health care provider to lose weight, if needed.  Do not use any products that contain nicotine or tobacco, such as cigarettes and e-cigarettes. If you need help quitting, ask your health care provider.  Before using a vacuum pump, read the instructions that come with the pump and discuss any questions with your health care provider.  Keep all follow-up visits as told by your health care provider. This is important. Contact a health care provider if:  You feel nauseous.  You vomit. Get help right away if:  You are taking oral or injectable medicines and you have an erection that lasts longer than 4 hours. If your health care provider is unavailable, go to the nearest emergency room for evaluation. An erection that lasts much longer than 4 hours can result in permanent damage to your penis.  You have severe pain in your groin or abdomen.  You develop redness or severe swelling of your penis.  You have redness spreading up into your groin or lower abdomen.  You are unable to urinate.  You experience chest pain or a rapid heart beat (palpitations) after taking oral medicines. Summary  Erectile dysfunction (ED) is the inability to get or keep an erection during sexual intercourse. This problem can usually be treated successfully.  This condition is diagnosed based on a physical exam, your symptoms, and tests to determine the cause. Treatment varies depending on the cause and may include medicines, hormone therapy, surgery, or a vacuum pump.  You may need follow-up visits to make sure that you are using your medicines or devices correctly.  Get help right away if you are taking or injecting medicines and you have an erection that lasts longer than 4 hours. This information is not intended to replace advice given to you by your health  care provider. Make sure you discuss any questions you have with your health care provider. Document Revised: 11/23/2019 Document Reviewed: 11/23/2019 Elsevier Patient Education  2021 [employer]  

## 2020-08-01 NOTE — Progress Notes (Signed)
Urological Symptom Review  Patient is experiencing the following symptoms: Blood in urine Sexually transmitted disease   Review of Systems  Gastrointestinal (upper)  : Negative for upper GI symptoms  Gastrointestinal (lower) : Negative for lower GI symptoms  Constitutional : Negative for symptoms  Skin: Negative for skin symptoms  Eyes: Blurred vision  Ear/Nose/Throat : Negative for Ear/Nose/Throat symptoms  Hematologic/Lymphatic: Negative for Hematologic/Lymphatic symptoms  Cardiovascular : Negative for cardiovascular symptoms  Respiratory : Negative for respiratory symptoms  Endocrine: Negative for endocrine symptoms  Musculoskeletal: Back pain Joint pain  Neurological: Negative for neurological symptoms  Psychologic: Negative for psychiatric symptoms

## 2020-08-21 ENCOUNTER — Ambulatory Visit: Payer: BC Managed Care – PPO | Admitting: Orthopedic Surgery

## 2020-12-10 ENCOUNTER — Other Ambulatory Visit: Payer: Self-pay

## 2020-12-10 ENCOUNTER — Ambulatory Visit: Payer: BC Managed Care – PPO | Admitting: Urology

## 2020-12-10 ENCOUNTER — Encounter: Payer: Self-pay | Admitting: Urology

## 2020-12-10 VITALS — BP 143/84 | HR 72 | Ht 71.0 in | Wt 198.1 lb

## 2020-12-10 DIAGNOSIS — N529 Male erectile dysfunction, unspecified: Secondary | ICD-10-CM | POA: Diagnosis not present

## 2020-12-10 MED ORDER — TADALAFIL 5 MG PO TABS: 5.0000 mg | ORAL_TABLET | Freq: Every day | ORAL | 11 refills | Status: AC

## 2020-12-10 NOTE — Progress Notes (Signed)
Urological Symptom Review ° °Patient is experiencing the following symptoms: °Get up at night to urinate ° ° °Review of Systems ° °Gastrointestinal (upper)  : °Indigestion/heartburn ° °Gastrointestinal (lower) : °Negative for lower GI symptoms ° °Constitutional : °Negative for symptoms ° °Skin: °Negative for skin symptoms ° °Eyes: °Negative for eye symptoms ° °Ear/Nose/Throat : °Negative for Ear/Nose/Throat symptoms ° °Hematologic/Lymphatic: °Negative for Hematologic/Lymphatic symptoms ° °Cardiovascular : °Negative for cardiovascular symptoms ° °Respiratory : °Negative for respiratory symptoms ° °Endocrine: °Negative for endocrine symptoms ° °Musculoskeletal: °Joint pain ° °Neurological: °Negative for neurological symptoms ° °Psychologic: °Negative for psychiatric symptoms °

## 2020-12-10 NOTE — Progress Notes (Signed)
12/10/2020 4:02 PM   Drew Wheeler 03-02-1961 [id number]  Referring provider: No referring provider defined for this encounter.  Followup erectile dysfunction   HPI: [name] is a 60yo here for followup for erectile dysfunction. He is using tadalafil 20mg  prn which intermittently gives him a firm erection. He also cannot maintain his erection. He denies any LUTS. No gross hematuria. No other complaints today   PMH: History reviewed. No pertinent past medical history.  Surgical History: Past Surgical History:  Procedure Laterality Date   HAND SURGERY     TESTICLE SURGERY      Home Medications:  Allergies as of 12/10/2020   No Known Allergies      Medication List        Accurate as of December 10, 2020  4:02 PM. If you have any questions, ask your nurse or doctor.          B-1 PO Take 1 tablet by mouth daily.   B-12 PO Take 1 tablet by mouth daily.   GINSENG PO Take 1 tablet by mouth daily.   ibuprofen 400 MG tablet Commonly known as: ADVIL Take by mouth.   pyridOXINE 100 MG tablet Commonly known as: VITAMIN B-6 Take 100 mg by mouth daily.   tadalafil 20 MG tablet Commonly known as: CIALIS Take 1 tablet (20 mg total) by mouth as needed.        Allergies: No Known Allergies  Family History: History reviewed. No pertinent family history.  Social History:  reports that he has never smoked. He has never used smokeless tobacco. He reports that he does not drink alcohol and does not use drugs.  ROS: All other review of systems were reviewed and are negative except what is noted above in HPI  Physical Exam: BP (!) 143/84   Pulse 72   Ht 5\' 11"  (1.803 m)   Wt 198 lb 2 oz (89.9 kg)   BMI 27.63 kg/m   Constitutional:  Alert and oriented, No acute distress. HEENT: [place] AT, moist mucus membranes.  Trachea midline, no masses. Cardiovascular: No clubbing, cyanosis, or edema. Respiratory: Normal respiratory effort, no increased work of  breathing. GI: Abdomen is soft, nontender, nondistended, no abdominal masses GU: No CVA tenderness.  Lymph: No cervical or inguinal lymphadenopathy. Skin: No rashes, bruises or suspicious lesions. Neurologic: Grossly intact, no focal deficits, moving all 4 extremities. Psychiatric: Normal mood and affect.  Laboratory Data: No results found for: WBC, HGB, HCT, MCV, PLT  Lab Results  Component Value Date   CREATININE 1.30 (H) 11/20/2019    No results found for: PSA  Lab Results  Component Value Date   TESTOSTERONE 332 06/16/2020    No results found for: HGBA1C  Urinalysis    Component Value Date/Time   COLORURINE YELLOW 02/25/2020 0613   APPEARANCEUR Clear 06/02/2020 1632   LABSPEC 1.012 02/25/2020 0613   PHURINE 6.0 02/25/2020 0613   GLUCOSEU Negative 06/02/2020 1632   HGBUR NEGATIVE 02/25/2020 0613   BILIRUBINUR Negative 06/02/2020 1632   KETONESUR NEGATIVE 02/25/2020 0613   PROTEINUR Negative 06/02/2020 1632   PROTEINUR NEGATIVE 02/25/2020 0613   UROBILINOGEN 0.2 06/29/2011 1332   NITRITE Negative 06/02/2020 1632   NITRITE NEGATIVE 02/25/2020 0613   LEUKOCYTESUR Negative 06/02/2020 1632   LEUKOCYTESUR NEGATIVE 02/25/2020 0613    Lab Results  Component Value Date   LABMICR See below: 06/02/2020   WBCUA None seen 06/02/2020   LABEPIT None seen 06/02/2020   BACTERIA None seen 06/02/2020  Pertinent Imaging:  No results found for this or any previous visit.  No results found for this or any previous visit.  No results found for this or any previous visit.  No results found for this or any previous visit.  No results found for this or any previous visit.  No results found for this or any previous visit.  Results for orders placed during the hospital encounter of 11/20/19  CT HEMATURIA WORKUP  Narrative CLINICAL DATA:  Gross hematuria  EXAM: CT ABDOMEN AND PELVIS WITHOUT AND WITH CONTRAST  TECHNIQUE: Multidetector CT imaging of the abdomen and  pelvis was performed following the standard protocol before and following the bolus administration of intravenous contrast.  CONTRAST:  OMNIPAQUE IOHEXOL 300 MG/ML  SOLN  COMPARISON:  None.  FINDINGS: Lower chest: 4 mm calcified granuloma in the lingula (series 5/image 16). Mild coronary atherosclerosis in the LAD and right coronary artery.  Hepatobiliary: 4 mm cyst in the posterior right hepatic lobe (series 8/image 18).  Gallbladder is unremarkable. No intrahepatic or extrahepatic ductal dilatation.  Pancreas: Within normal limits.  Spleen: Within normal limits.  Adrenals/Urinary Tract: Adrenal glands are within normal limits.  Two punctate nonobstructing left upper pole renal calculi (coronal image 63).  Two 2 mm nonobstructing right upper pole renal calculi (coronal images 61 and 63). Additional punctate nonobstructing right lower pole renal calculus (coronal image 57). No ureteral or bladder calculi. No hydronephrosis.  Small left renal cysts, measuring up to 14 mm in the posterior interpolar left kidney (series 8/image 33). No enhancing renal lesions.  On delayed imaging, there are no filling defects in the bilateral opacified proximal collecting systems, ureters, or bladder.  Thick-walled bladder, although underdistended.  Stomach/Bowel: Stomach is within normal limits.  No evidence of bowel obstruction.  Normal appendix (series 8/image 56).  Vascular/Lymphatic: No evidence of abdominal aortic aneurysm.  Atherosclerotic calcifications of the abdominal aorta and branch vessels.  No suspicious abdominopelvic lymphadenopathy.  Reproductive: Prostate is unremarkable.  Other: No abdominopelvic ascites.  Musculoskeletal: Mild degenerative changes of the visualized thoracolumbar spine.  IMPRESSION: Small bilateral nonobstructing renal calculi measuring up to 2 mm. No ureteral or bladder calculi. No hydronephrosis.  Mildly thick-walled bladder,  although underdistended. In the setting of hematuria, consider cystoscopy if not already performed.  Additional ancillary findings as above.   Electronically Signed By: Charline Bills M.D. On: 11/21/2019 10:26  No results found for this or any previous visit.   Assessment & Plan:    1. Erectile dysfunction, unspecified erectile dysfunction type -We will start tadalafil 5mg  daily and 20mg  prn - Urinalysis, Routine w reflex microscopic   Return in about 6 months (around 06/09/2021).  , MD  [facility] [place]

## 2020-12-10 NOTE — Patient Instructions (Signed)
Erectile Dysfunction °Erectile dysfunction (ED) is the inability to get or keep an erection in order to have sexual intercourse. ED is considered a symptom of an underlying disorder and is not considered a disease. ED may include: °Inability to get an erection. °Lack of enough hardness of the erection to allow penetration. °Loss of erection before sex is finished. °What are the causes? °This condition may be caused by: °Physical causes, such as: °Artery problems. This may include heart disease, high blood pressure, atherosclerosis, and diabetes. °Hormonal problems, such as low testosterone. °Obesity. °Nerve problems. This may include back or pelvic injuries, multiple sclerosis, Parkinson's disease, spinal cord injury, and stroke. °Certain medicines, such as: °Pain relievers. °Antidepressants. °Blood pressure medicines and water pills (diuretics). °Cancer medicines. °Antihistamines. °Muscle relaxants. °Lifestyle factors, such as: °Use of drugs such as marijuana, cocaine, or opioids. °Excessive use of alcohol. °Smoking. °Lack of physical activity or exercise. °Psychological causes, such as: °Anxiety or stress. °Sadness or depression. °Exhaustion. °Fear about sexual performance. °Guilt. °What are the signs or symptoms? °Symptoms of this condition include: °Inability to get an erection. °Lack of enough hardness of the erection to allow penetration. °Loss of the erection before sex is finished. °Sometimes having normal erections, but with frequent unsatisfactory episodes. °Low sexual satisfaction in either partner due to erection problems. °A curved penis occurring with erection. The curve may cause pain, or the penis may be too curved to allow for intercourse. °Never having nighttime or morning erections. °How is this diagnosed? °This condition is often diagnosed by: °Performing a physical exam to find other diseases or specific problems with the penis. °Asking you detailed questions about the problem. °Doing tests,  such as: °Blood tests to check for diabetes mellitus or high cholesterol, or to measure hormone levels. °Other tests to check for underlying health conditions. °An ultrasound exam to check for scarring. °A test to check blood flow to the penis. °Doing a sleep study at home to measure nighttime erections. °How is this treated? °This condition may be treated by: °Medicines, such as: °Medicine taken by mouth to help you achieve an erection (oral medicine). °Hormone replacement therapy to replace low testosterone levels. °Medicine that is injected into the penis. Your health care provider may instruct you how to give yourself these injections at home. °Medicine that is delivered with a short applicator tube. The tube is inserted into the opening at the tip of the penis, which is the opening of the urethra. A tiny pellet of medicine is put in the urethra. The pellet dissolves and enhances erectile function. This is also called MUSE (medicated urethral system for erections) therapy. °Vacuum pump. This is a pump with a ring on it. The pump and ring are placed on the penis and used to create pressure that helps the penis become erect. °Penile implant surgery. In this procedure, you may receive: °An inflatable implant. This consists of cylinders, a pump, and a reservoir. The cylinders can be inflated with a fluid that helps to create an erection, and they can be deflated after intercourse. °A semi-rigid implant. This consists of two silicone rubber rods. The rods provide some rigidity. They are also flexible, so the penis can both curve downward in its normal position and become straight for sexual intercourse. °Blood vessel surgery to improve blood flow to the penis. During this procedure, a blood vessel from a different part of the body is placed into the penis to allow blood to flow around (bypass) damaged or blocked blood vessels. °Lifestyle changes,   such as exercising more, losing weight, and quitting smoking. °Follow  these instructions at home: °Medicines ° °Take over-the-counter and prescription medicines only as told by your health care provider. Do not increase the dosage without first discussing it with your health care provider. °If you are using self-injections, do injections as directed by your health care provider. Make sure you avoid any veins that are on the surface of the penis. After giving an injection, apply pressure to the injection site for 5 minutes. °Talk to your health care provider about how to prevent headaches while taking ED medicines. These medicines may cause a sudden headache due to the increase in blood flow in your body. °General instructions °Exercise regularly, as directed by your health care provider. Work with your health care provider to lose weight, if needed. °Do not use any products that contain nicotine or tobacco. These products include cigarettes, chewing tobacco, and vaping devices, such as e-cigarettes. If you need help quitting, ask your health care provider. °Before using a vacuum pump, read the instructions that come with the pump and discuss any questions with your health care provider. °Keep all follow-up visits. This is important. °Contact a health care provider if: °You feel nauseous. °You are vomiting. °You get sudden headaches while taking ED medicines. °You have any concerns about your sexual health. °Get help right away if: °You are taking oral or injectable medicines and you have an erection that lasts longer than 4 hours. If your health care provider is unavailable, go to the nearest emergency room for evaluation. An erection that lasts much longer than 4 hours can result in permanent damage to your penis. °You have severe pain in your groin or abdomen. °You develop redness or severe swelling of your penis. °You have redness spreading at your groin or lower abdomen. °You are unable to urinate. °You experience chest pain or a rapid heartbeat (palpitations) after taking oral  medicines. °These symptoms may represent a serious problem that is an emergency. Do not wait to see if the symptoms will go away. Get medical help right away. Call your local emergency services (911 in the [place] Do not drive yourself to the hospital. °Summary °Erectile dysfunction (ED) is the inability to get or keep an erection during sexual intercourse. °This condition is diagnosed based on a physical exam, your symptoms, and tests to determine the cause. Treatment varies depending on the cause and may include medicines, hormone therapy, surgery, or a vacuum pump. °You may need follow-up visits to make sure that you are using your medicines or devices correctly. °Get help right away if you are taking or injecting medicines and you have an erection that lasts longer than 4 hours. °This information is not intended to replace advice given to you by your health care provider. Make sure you discuss any questions you have with your health care provider. °Document Revised: 06/04/2020 Document Reviewed: 06/04/2020 °Elsevier Patient Education © 2022 [employer] ° °

## 2021-02-12 ENCOUNTER — Other Ambulatory Visit: Payer: Self-pay

## 2021-02-12 ENCOUNTER — Emergency Department (HOSPITAL_COMMUNITY)
Admission: EM | Admit: 2021-02-12 | Discharge: 2021-02-12 | Disposition: A | Discharge status: Home / Self Care | Payer: BC Managed Care – PPO | Attending: Emergency Medicine | Admitting: Emergency Medicine

## 2021-02-12 DIAGNOSIS — Z711 Person with feared health complaint in whom no diagnosis is made: Secondary | ICD-10-CM | POA: Diagnosis present

## 2021-02-12 LAB — URINALYSIS, ROUTINE W REFLEX MICROSCOPIC
Bilirubin Urine: NEGATIVE
Glucose, UA: NEGATIVE mg/dL
Hgb urine dipstick: NEGATIVE
Ketones, ur: NEGATIVE mg/dL
Leukocytes,Ua: NEGATIVE
Nitrite: NEGATIVE
Protein, ur: NEGATIVE mg/dL
Specific Gravity, Urine: 1.01 (ref 1.005–1.030)
pH: 7 (ref 5.0–8.0)

## 2021-02-12 NOTE — ED Triage Notes (Signed)
Pt requesting a STD test. States his groin area is warm and that is not normal for him. Pt denies any pain, denies any discharge and no issues with urination.

## 2021-02-12 NOTE — Discharge Instructions (Signed)
There is no sign of infection at this time.  All the results will be back in a day or 2.  You can follow the results on MyChart.  At this time there is no reason to be treated for STD.

## 2021-02-12 NOTE — ED Provider Notes (Signed)
[facility] EMERGENCY DEPARTMENT Provider Note   CSN: [id number] Arrival date & time: 02/12/21  1313     History No chief complaint on file.   Drew Wheeler is a 60 y.o. male.  HPI He presents for evaluation with concern for STD.  He states he has a warm sensation in his groin.  He was with a new partner last week and he wants to make sure he does not have an STD.  Previously he states that he contracted chlamydia from her.  He denies fever, chills, dysuria, urinary frequency, urethral drainage, testicular pain, groin pain or skin changes around the perineum.  There are no other known active modifying factors.    No past medical history on file.  Patient Active Problem List   Diagnosis Date Noted   Erectile dysfunction 08/01/2020   Gross hematuria 10/17/2019   Elevated PSA, less than 10 ng/ml 10/26/2016    Past Surgical History:  Procedure Laterality Date   HAND SURGERY     TESTICLE SURGERY         No family history on file.  Social History   Tobacco Use   Smoking status: Never   Smokeless tobacco: Never  Vaping Use   Vaping Use: Never used  Substance Use Topics   Alcohol use: No   Drug use: No    Home Medications Prior to Admission medications   Medication Sig Start Date End Date Taking? Authorizing Provider  Cyanocobalamin (B-12 PO) Take 1 tablet by mouth daily.    [provider]  GINSENG PO Take 1 tablet by mouth daily.    [provider]  ibuprofen (ADVIL) 400 MG tablet Take by mouth. 11/27/19   [provider]  pyridOXINE (VITAMIN B-6) 100 MG tablet Take 100 mg by mouth daily.    [provider]  tadalafil (CIALIS) 20 MG tablet Take 1 tablet (20 mg total) by mouth as needed. 08/01/20   McKenzie, Mardene Celeste, MD  tadalafil (CIALIS) 5 MG tablet Take 1 tablet (5 mg total) by mouth daily. 12/10/20   McKenzie, Mardene Celeste, MD  Thiamine HCl (B-1 PO) Take 1 tablet by mouth daily.    [provider]    Allergies     Patient has no known allergies.  Review of Systems   Review of Systems  All other systems reviewed and are negative.  Physical Exam Updated Vital Signs BP (!) 131/96 (BP Location: Right Arm)   Pulse 79   Temp 97.7 F (36.5 C) (Oral)   Resp 18   Ht 5\' 11"  (1.803 m)   Wt 89.4 kg   SpO2 100%   BMI 27.48 kg/m   Physical Exam Vitals and nursing note reviewed.  Constitutional:      General: He is not in acute distress.    Appearance: He is well-developed. He is not ill-appearing, toxic-appearing or diaphoretic.  HENT:     Head: Normocephalic and atraumatic.     Right Ear: External ear normal.     Left Ear: External ear normal.  Eyes:     Conjunctiva/sclera: Conjunctivae normal.     Pupils: Pupils are equal, round, and reactive to light.  Neck:     Trachea: Phonation normal.  Cardiovascular:     Rate and Rhythm: Normal rate.  Pulmonary:     Effort: Pulmonary effort is normal.  Abdominal:     General: There is no distension.  Genitourinary:    Comments: Normal external male genitalia.  No urethral discharge or  bleeding.  Normal penis, normal scrotum, normal scrotal contents.  No inguinal adenopathy or skin changes. Musculoskeletal:        General: Normal range of motion.     Cervical back: Normal range of motion and neck supple.  Skin:    General: Skin is warm and dry.  Neurological:     Mental Status: He is alert and oriented to person, place, and time.     Cranial Nerves: No cranial nerve deficit.     Sensory: No sensory deficit.     Motor: No abnormal muscle tone.     Coordination: Coordination normal.  Psychiatric:        Mood and Affect: Mood normal.        Behavior: Behavior normal.        Thought Content: Thought content normal.        Judgment: Judgment normal.    ED Results / Procedures / Treatments   Labs (all labs ordered are listed, but only abnormal results are displayed) Labs Reviewed  RPR  URINALYSIS, ROUTINE W REFLEX MICROSCOPIC  GC/CHLAMYDIA  PROBE AMP ([place]) NOT AT [facility]    EKG None  Radiology No results found.  Procedures Procedures   Medications Ordered in ED Medications - No data to display  ED Course  I have reviewed the triage vital signs and the nursing notes.  Pertinent labs & imaging results that were available during my care of the patient were reviewed by me and considered in my medical decision making (see chart for details).    MDM Rules/Calculators/A&P                            No data found.    Medical Decision Making:  This patient is presenting for evaluation of concern for STD, which does require a range of treatment options, and is a complaint that involves a moderate risk of morbidity and mortality. The differential diagnoses include STD infections including chlamydia and GC, UTI, malaise. I decided to review old records, and in summary millage male presenting with concern for STD, without overt physical findings.  I did not require additional historical information from anyone.  Clinical Laboratory Tests Ordered, included Urinalysis and GC chlamydia, RPR testing . Review indicates negative as of 01/14/2021 at 10:30 AM..   Critical Interventions-clinical evaluation  After These Interventions, the Patient was reevaluated and was found stable for discharge.  Screening labs sent.  Low level suspicion for STD.  CRITICAL CARE-no Performed by: Daleen Bo  Nursing Notes Reviewed/ Care Coordinated Applicable Imaging Reviewed Interpretation of Laboratory Data incorporated into ED treatment  The patient appears reasonably screened and/or stabilized for discharge and I doubt any other medical condition or other [facility] requiring further screening, evaluation, or treatment in the ED at this time prior to discharge.  Plan: Home Medications-routine OTC; Home Treatments-safe sex; return here if the recommended treatment, does not improve the symptoms; Recommended follow up-PCP, as  needed     Final Clinical Impression(s) / ED Diagnoses Final diagnoses:  Concern about STD in male without diagnosis    Rx / DC Orders ED Discharge Orders     None        Daleen Bo, MD 02/14/21 1039

## 2021-02-13 LAB — GC/CHLAMYDIA PROBE AMP (~~LOC~~) NOT AT ARMC
Chlamydia: NEGATIVE
Comment: NEGATIVE
Comment: NORMAL
Neisseria Gonorrhea: NEGATIVE

## 2021-02-13 LAB — RPR: RPR Ser Ql: NONREACTIVE

## 2021-02-22 ENCOUNTER — Emergency Department (HOSPITAL_COMMUNITY)
Admission: EM | Admit: 2021-02-22 | Discharge: 2021-02-22 | Disposition: A | Discharge status: Home / Self Care | Payer: BC Managed Care – PPO | Attending: Emergency Medicine | Admitting: Emergency Medicine

## 2021-02-22 ENCOUNTER — Encounter (HOSPITAL_COMMUNITY): Payer: Self-pay | Admitting: *Deleted

## 2021-02-22 ENCOUNTER — Other Ambulatory Visit: Payer: Self-pay

## 2021-02-22 DIAGNOSIS — Z202 Contact with and (suspected) exposure to infections with a predominantly sexual mode of transmission: Secondary | ICD-10-CM | POA: Diagnosis present

## 2021-02-22 LAB — URINALYSIS, ROUTINE W REFLEX MICROSCOPIC
Bilirubin Urine: NEGATIVE
Glucose, UA: NEGATIVE mg/dL
Hgb urine dipstick: NEGATIVE
Ketones, ur: NEGATIVE mg/dL
Leukocytes,Ua: NEGATIVE
Nitrite: NEGATIVE
Protein, ur: NEGATIVE mg/dL
Specific Gravity, Urine: 1.02 (ref 1.005–1.030)
pH: 7 (ref 5.0–8.0)

## 2021-02-22 MED ORDER — LIDOCAINE HCL (PF) 1 % IJ SOLN
1.0000 mL | Freq: Once | INTRAMUSCULAR | Status: AC
  Administered 2021-02-22: 09:00:00 1 mL
  Filled 2021-02-22: qty 30

## 2021-02-22 MED ORDER — CEFTRIAXONE SODIUM 500 MG IJ SOLR
500.0000 mg | Freq: Once | INTRAMUSCULAR | Status: AC
  Administered 2021-02-22: 09:00:00 500 mg via INTRAMUSCULAR
  Filled 2021-02-22: qty 500

## 2021-02-22 MED ORDER — AZITHROMYCIN 250 MG PO TABS
1000.0000 mg | ORAL_TABLET | Freq: Once | ORAL | Status: AC
  Administered 2021-02-22: 09:00:00 1000 mg via ORAL
  Filled 2021-02-22: qty 4

## 2021-02-22 NOTE — ED Triage Notes (Signed)
Pt c/o "milky" colored penile discharge and aching and sharp pain in groin area along with tingling prior to urinating that started early yesterday. Pt's wife recently tested positive for a STD. Pt was seen and tested for STD a week or so ago, but everything was negative at that time.

## 2021-02-22 NOTE — Discharge Instructions (Signed)
Antibiotics given today should cover any STD exposure.  Return for any new or worse symptoms.

## 2021-02-22 NOTE — ED Provider Notes (Signed)
[facility] EMERGENCY DEPARTMENT Provider Note   CSN: [id number] Arrival date & time: 02/22/21  1497     History Chief Complaint  Patient presents with   Exposure to STD    Drew Wheeler is a 60 y.o. male.  Patient seen November 24 for concerns for STD exposure.  Was not treated with antibiotics at time because there was no symptoms.  Patient back stating that his wife had a positive test.  And that she is on doxycycline.  He stating that he has a milky colored penile discharge.      History reviewed. No pertinent past medical history.  Patient Active Problem List   Diagnosis Date Noted   Erectile dysfunction 08/01/2020   Gross hematuria 10/17/2019   Elevated PSA, less than 10 ng/ml 10/26/2016    Past Surgical History:  Procedure Laterality Date   HAND SURGERY     TESTICLE SURGERY         No family history on file.  Social History   Tobacco Use   Smoking status: Never   Smokeless tobacco: Never  Vaping Use   Vaping Use: Never used  Substance Use Topics   Alcohol use: No   Drug use: No    Home Medications Prior to Admission medications   Medication Sig Start Date End Date Taking? Authorizing Provider  Cyanocobalamin (B-12 PO) Take 1 tablet by mouth daily.    [provider]  GINSENG PO Take 1 tablet by mouth daily.    [provider]  ibuprofen (ADVIL) 400 MG tablet Take by mouth. 11/27/19   [provider]  pyridOXINE (VITAMIN B-6) 100 MG tablet Take 100 mg by mouth daily.    [provider]  tadalafil (CIALIS) 20 MG tablet Take 1 tablet (20 mg total) by mouth as needed. 08/01/20   McKenzie, Mardene Celeste, MD  tadalafil (CIALIS) 5 MG tablet Take 1 tablet (5 mg total) by mouth daily. 12/10/20   McKenzie, Mardene Celeste, MD  Thiamine HCl (B-1 PO) Take 1 tablet by mouth daily.    [provider]    Allergies    Patient has no known allergies.  Review of Systems   Review of Systems  Constitutional:  Negative for chills  and fever.  HENT:  Negative for ear pain and sore throat.   Eyes:  Negative for pain and visual disturbance.  Respiratory:  Negative for cough and shortness of breath.   Cardiovascular:  Negative for chest pain and palpitations.  Gastrointestinal:  Negative for abdominal pain and vomiting.  Genitourinary:  Positive for penile discharge. Negative for dysuria, hematuria, penile swelling, scrotal swelling and testicular pain.  Musculoskeletal:  Negative for arthralgias and back pain.  Skin:  Negative for color change and rash.  Neurological:  Negative for seizures and syncope.  All other systems reviewed and are negative.  Physical Exam Updated Vital Signs BP (!) 154/98 (BP Location: Right Arm)   Pulse 86   Temp 98.3 F (36.8 C) (Oral)   Resp 16   Ht 1.803 m (5\' 11" )   Wt 89.4 kg   SpO2 98%   BMI 27.48 kg/m   Physical Exam Vitals and nursing note reviewed.  Constitutional:      General: He is not in acute distress.    Appearance: Normal appearance. He is well-developed.  HENT:     Head: Normocephalic and atraumatic.  Eyes:     Extraocular Movements: Extraocular movements intact.     Conjunctiva/sclera: Conjunctivae normal.  Pupils: Pupils are equal, round, and reactive to light.  Cardiovascular:     Rate and Rhythm: Normal rate and regular rhythm.     Heart sounds: No murmur heard. Pulmonary:     Effort: Pulmonary effort is normal. No respiratory distress.     Breath sounds: Normal breath sounds.  Abdominal:     Palpations: Abdomen is soft.     Tenderness: There is no abdominal tenderness.  Genitourinary:    Penis: Normal.      Testes: Normal.     Comments: Patient uncircumcised.  Not able to elicit penile discharge.  No groin adenopathy.  No penile lesions.  Testicles nontender scrotum without any swelling Musculoskeletal:        General: No swelling.     Cervical back: Normal range of motion and neck supple.  Skin:    General: Skin is warm and dry.      Capillary Refill: Capillary refill takes less than 2 seconds.  Neurological:     Mental Status: He is alert.  Psychiatric:        Mood and Affect: Mood normal.    ED Results / Procedures / Treatments   Labs (all labs ordered are listed, but only abnormal results are displayed) Labs Reviewed  URINALYSIS, ROUTINE W REFLEX MICROSCOPIC  RPR  GC/CHLAMYDIA PROBE AMP ([place]) NOT AT [facility]    EKG None  Radiology No results found.  Procedures Procedures   Medications Ordered in ED Medications  cefTRIAXone (ROCEPHIN) injection 500 mg (has no administration in time range)  lidocaine (PF) (XYLOCAINE) 1 % injection 1 mL (has no administration in time range)  azithromycin (ZITHROMAX) tablet 1,000 mg (has no administration in time range)    ED Course  I have reviewed the triage vital signs and the nursing notes.  Pertinent labs & imaging results that were available during my care of the patient were reviewed by me and considered in my medical decision making (see chart for details).    MDM Rules/Calculators/A&P                           STD exposure.  Urine without any acute findings.  However sent for STD.  We will go and treat empirically.  Patient will get Rocephin and azithromycin.   Final Clinical Impression(s) / ED Diagnoses Final diagnoses:  STD exposure    Rx / DC Orders ED Discharge Orders     None        Fredia Sorrow, MD 02/22/21 [phone]

## 2021-02-23 LAB — GC/CHLAMYDIA PROBE AMP (~~LOC~~) NOT AT ARMC
Chlamydia: NEGATIVE
Comment: NEGATIVE
Comment: NORMAL
Neisseria Gonorrhea: NEGATIVE

## 2021-02-23 LAB — RPR: RPR Ser Ql: NONREACTIVE

## 2021-02-26 ENCOUNTER — Other Ambulatory Visit: Payer: Self-pay

## 2021-02-26 ENCOUNTER — Encounter (HOSPITAL_COMMUNITY): Payer: Self-pay

## 2021-02-26 ENCOUNTER — Emergency Department (HOSPITAL_COMMUNITY)
Admission: EM | Admit: 2021-02-26 | Discharge: 2021-02-26 | Disposition: A | Discharge status: Home / Self Care | Payer: BC Managed Care – PPO | Attending: Emergency Medicine | Admitting: Emergency Medicine

## 2021-02-26 DIAGNOSIS — R369 Urethral discharge, unspecified: Secondary | ICD-10-CM | POA: Insufficient documentation

## 2021-02-26 DIAGNOSIS — D72829 Elevated white blood cell count, unspecified: Secondary | ICD-10-CM | POA: Diagnosis not present

## 2021-02-26 LAB — URINALYSIS, ROUTINE W REFLEX MICROSCOPIC
Bilirubin Urine: NEGATIVE
Glucose, UA: NEGATIVE mg/dL
Hgb urine dipstick: NEGATIVE
Ketones, ur: NEGATIVE mg/dL
Nitrite: NEGATIVE
Protein, ur: NEGATIVE mg/dL
Specific Gravity, Urine: 1.02 (ref 1.005–1.030)
pH: 6 (ref 5.0–8.0)

## 2021-02-26 LAB — URINALYSIS, MICROSCOPIC (REFLEX)
Bacteria, UA: NONE SEEN
RBC / HPF: NONE SEEN RBC/hpf (ref 0–5)
WBC, UA: >50 WBC/hpf (ref 0–5)

## 2021-02-26 MED ORDER — METRONIDAZOLE 500 MG PO TABS: 500.0000 mg | ORAL_TABLET | Freq: Two times a day (BID) | ORAL | 0 refills | Status: AC

## 2021-02-26 NOTE — ED Triage Notes (Signed)
Pt reports having penile d/c and itching x 1 day.  Reports his symptoms got better and then started again last night.  Reports able to void normally with no pain.

## 2021-02-26 NOTE — ED Provider Notes (Signed)
[facility] EMERGENCY DEPARTMENT Provider Note   CSN: [id number] Arrival date & time: 02/26/21  [id number]     History Chief Complaint  Patient presents with   Penile Discharge    Drew Wheeler is a 60 y.o. male.  HPI  Patient with medical history including ED, gross hematuria, elevated PSA presents with complaints of penile discharge.  Patient states that he noted some discharge last night, states at the very end of his urination he noticed a white like substance come out from his penis.  He denies actual penile pain, testicular pain, denies dysuria, hematuria, flank pain, back pain, abdominal pain nausea, vomiting, diarrhea.  He states that his wife is being treated for trichomonas.  He denies  alleviating or aggravating factors, does not endorse fevers, chills, chest pain, shortness of breath, abdominal pain, general body aches.  After reviewing patient's chart he was seen here 4 days ago for similar presentation GC chlamydia, syphilis all were negative at that time, he was treated prophylactically with Rocephin as well as azithromycin.  History reviewed. No pertinent past medical history.  Patient Active Problem List   Diagnosis Date Noted   Erectile dysfunction 08/01/2020   Gross hematuria 10/17/2019   Elevated PSA, less than 10 ng/ml 10/26/2016    Past Surgical History:  Procedure Laterality Date   HAND SURGERY     TESTICLE SURGERY         History reviewed. No pertinent family history.  Social History   Tobacco Use   Smoking status: Never   Smokeless tobacco: Never  Vaping Use   Vaping Use: Never used  Substance Use Topics   Alcohol use: No   Drug use: No    Home Medications Prior to Admission medications   Medication Sig Start Date End Date Taking? Authorizing Provider  Cyanocobalamin (B-12 PO) Take 1 tablet by mouth daily.    [provider]  GINSENG PO Take 1 tablet by mouth daily.    [provider]  ibuprofen (ADVIL) 400 MG tablet Take  by mouth. 11/27/19   [provider]  pyridOXINE (VITAMIN B-6) 100 MG tablet Take 100 mg by mouth daily.    [provider]  tadalafil (CIALIS) 20 MG tablet Take 1 tablet (20 mg total) by mouth as needed. 08/01/20   McKenzie, Mardene CelestePatrick L, MD  tadalafil (CIALIS) 5 MG tablet Take 1 tablet (5 mg total) by mouth daily. 12/10/20   McKenzie, Mardene CelestePatrick L, MD  Thiamine HCl (B-1 PO) Take 1 tablet by mouth daily.    [provider]    Allergies    Patient has no known allergies.  Review of Systems   Review of Systems  Constitutional:  Negative for chills and fever.  HENT:  Negative for congestion.   Respiratory:  Negative for shortness of breath.   Cardiovascular:  Negative for chest pain.  Gastrointestinal:  Negative for abdominal pain.  Genitourinary:  Positive for penile discharge. Negative for enuresis, flank pain, frequency, penile pain, penile swelling and scrotal swelling.  Musculoskeletal:  Negative for back pain.  Skin:  Negative for rash.  Neurological:  Negative for dizziness.  Hematological:  Does not bruise/bleed easily.   Physical Exam Updated Vital Signs BP (!) 146/99   Pulse 82   Temp 99.1 F (37.3 C)   Resp 16   Ht 5\' 11"  (1.803 m)   Wt 89.8 kg   SpO2 98%   BMI 27.62 kg/m   Physical Exam Vitals and nursing note reviewed.  Constitutional:  General: He is not in acute distress.    Appearance: He is not ill-appearing.  HENT:     Head: Normocephalic and atraumatic.     Nose: No congestion.  Eyes:     Conjunctiva/sclera: Conjunctivae normal.  Cardiovascular:     Rate and Rhythm: Normal rate and regular rhythm.  Pulmonary:     Effort: Pulmonary effort is normal.  Skin:    General: Skin is warm and dry.  Neurological:     Mental Status: He is alert.  Psychiatric:        Mood and Affect: Mood normal.    ED Results / Procedures / Treatments   Labs (all labs ordered are listed, but only abnormal results are displayed) Labs Reviewed   URINALYSIS, ROUTINE W REFLEX MICROSCOPIC - Abnormal; Notable for the following components:      Result Value   Leukocytes,Ua SMALL (*)    All other components within normal limits  URINALYSIS, MICROSCOPIC (REFLEX)    EKG None  Radiology No results found.  Procedures Procedures   Medications Ordered in ED Medications - No data to display  ED Course  I have reviewed the triage vital signs and the nursing notes.  Pertinent labs & imaging results that were available during my care of the patient were reviewed by me and considered in my medical decision making (see chart for details).    MDM Rules/Calculators/A&P                          Initial impression-presents with penile discharge, alert, no acute distress, vital signs reassuring.  Will order trichomonas as well as UA for further evaluation.  Work-up-UA shows small leukocytes no red blood cells, many white blood cells, no bacteria.  Reassessment-notified that we do not male wet prep to fully rule out trichomonas.  Since his wife is being actively treated for trichomonas and he has an abnormal UA showing many white blood cells with discharge will prophylactically treat him for trichomonas.  Rule out- Low suspicion for pyelonephritis or UTI as patient denies urinary symptoms, lower back pain, nausea vomiting fevers or chills, UA also negative for nitrates, only shows small leukocytes no red blood cells no bacteria.  UA slightly abnormal but without urinary symptoms will defer on treating for UTI will culture urine for further evaluation.  Low suspicion for epididymitis or prostatitis as patient denies saddle pain, testicles are nontender and stable patient.  Low suspicion for GC chlamydia, syphilis as all these tests were -4 days ago.  Low suspicion for systemic infection as patient is nontoxic-appearing, vital signs reassuring, no obvious source infection noted on exam.    Plan-  Penile discharge-unclear etiology but I suspect  this could be trichomonas as his partner is being treated for this, unfortunately do not have the test to confirm this.  We will start him on Flagyl, have him follow-up with urology for further evaluation.  Vital signs have remained stable, no indication for hospital admission.    Patient given at home care as well strict return precautions.  Patient verbalized that they understood agreed to said plan.  Final Clinical Impression(s) / ED Diagnoses Final diagnoses:  None    Rx / DC Orders ED Discharge Orders     None        Carroll Sage, PA-C 02/26/21 1058    Tanda Rockers A, DO 03/03/21 [phone]

## 2021-02-26 NOTE — Discharge Instructions (Addendum)
I am treating you for trichomonas, starting you on antibiotics please take as prescribed.  Do not consume alcohol take this medication.  Please remain abstinence to fully completed your antibiotics as you can give it to your partner.  If symptoms continue please follow-up with urology for further evaluation.  Or follow-up at the health department.  Come back to the emergency department if you develop chest pain, shortness of breath, severe abdominal pain, uncontrolled nausea, vomiting, diarrhea.

## 2021-02-27 LAB — URINE CULTURE: Culture: NO GROWTH

## 2021-06-10 ENCOUNTER — Ambulatory Visit: Payer: BC Managed Care – PPO | Admitting: Urology

## 2021-12-05 IMAGING — CT CT ABD-PEL WO/W CM
3 of 12 series · 11 of 46 positions shown, 17 images · IV contrast (Omnipaque or Isovue)
Comparison: None.

CLINICAL DATA: Gross hematuria

EXAM:
CT ABDOMEN AND PELVIS WITHOUT AND WITH CONTRAST
TECHNIQUE: Multidetector CT imaging of the abdomen and pelvis was performed
following the standard protocol before and following the bolus
administration of intravenous contrast.
CONTRAST:  125mL OMNIPAQUE IOHEXOL 300 MG/ML  SOLN

[Series 3: axial pre · axial · non-contrast · 0.83mm/px · z∈[+913,+1218]mm · 5 of 93 slices shown, 10 images]
[im 16/93  soft-tissue]
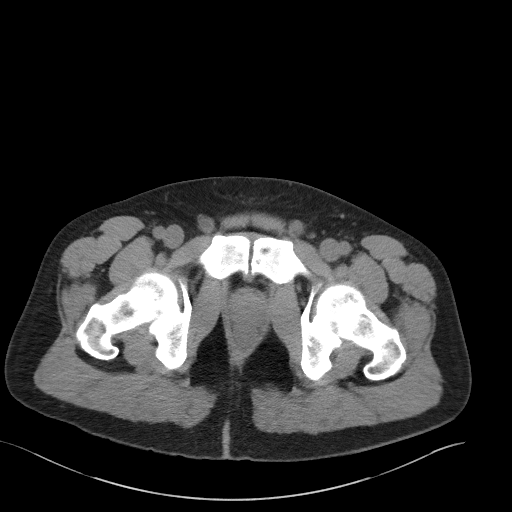
[im 16/93  bone]
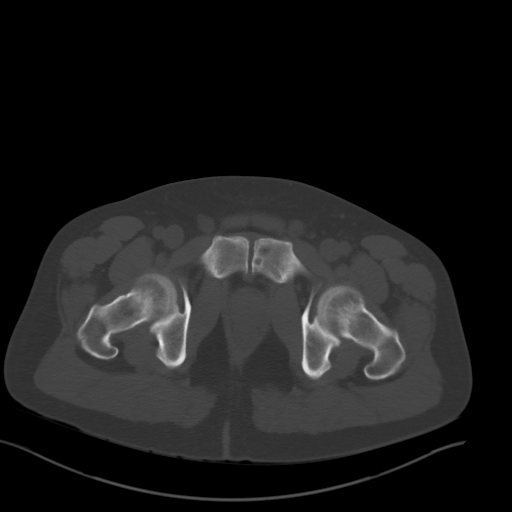
[im 31/93  soft-tissue]
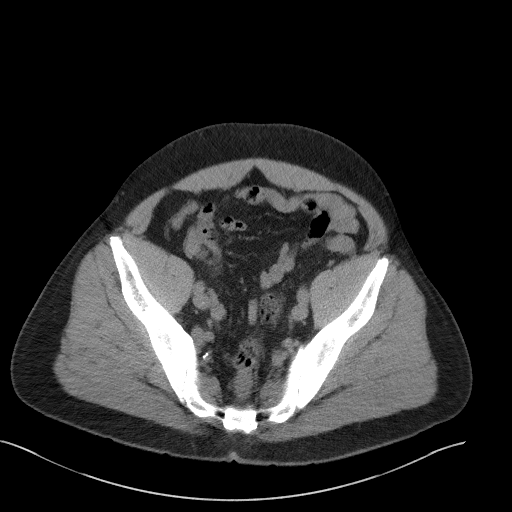
[im 31/93  lung]
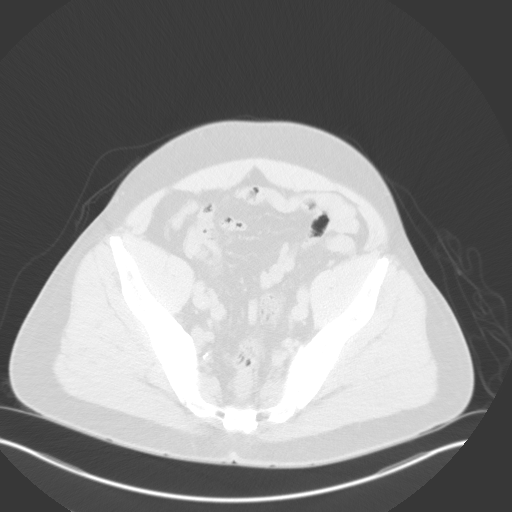
[im 47/93  soft-tissue]
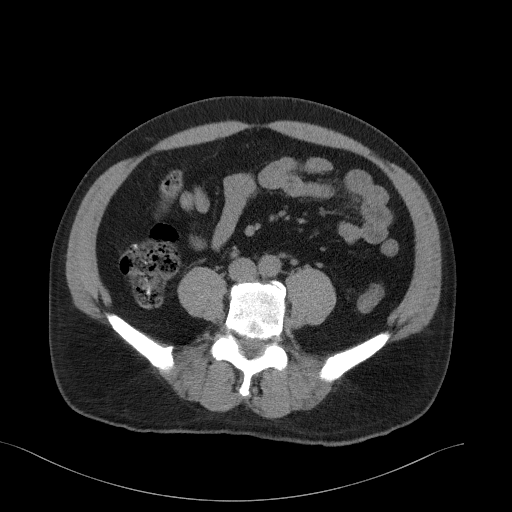
[im 47/93  lung]
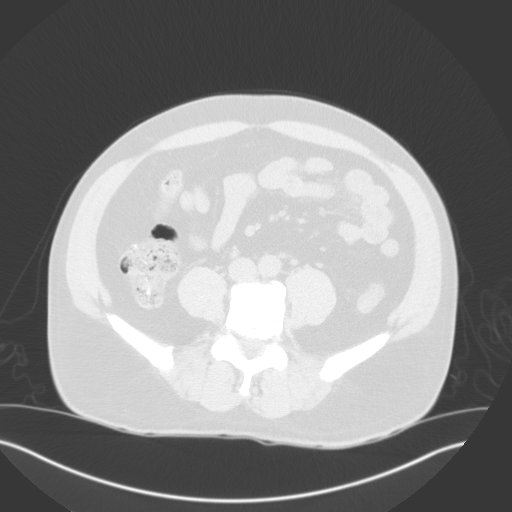
[im 62/93  soft-tissue]
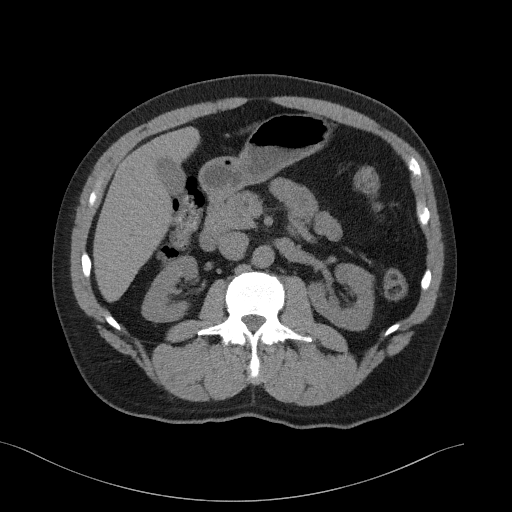
[im 62/93  lung]
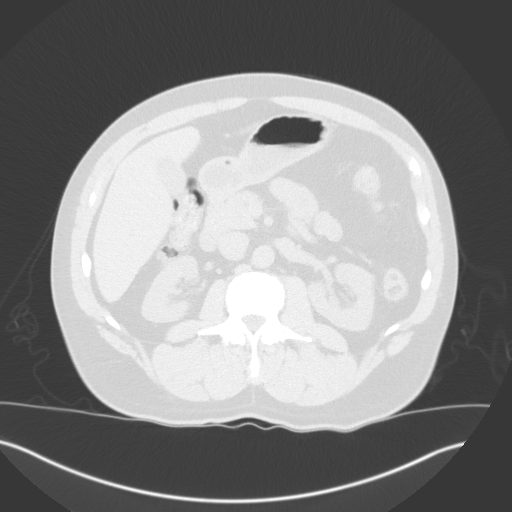
[im 77/93  soft-tissue]
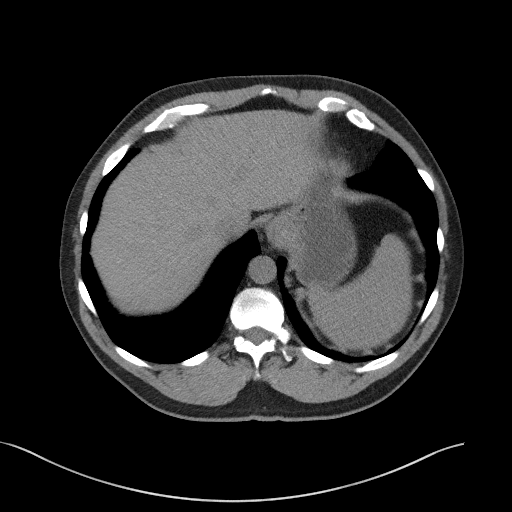
[im 77/93  lung]
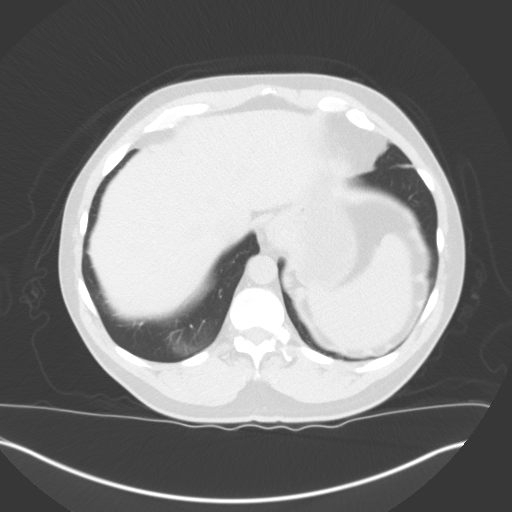

[Series 6: coronal pre · coronal · non-contrast · 0.81mm/px · 2 of 101 slices shown, 3 images]
[im 34/101  soft-tissue]
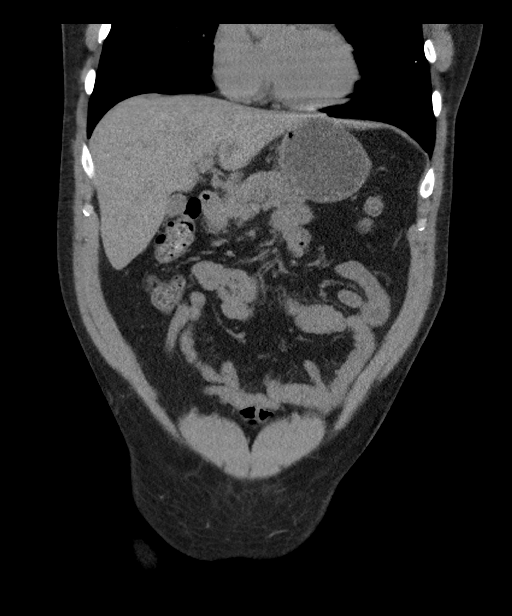
[im 34/101  bone]
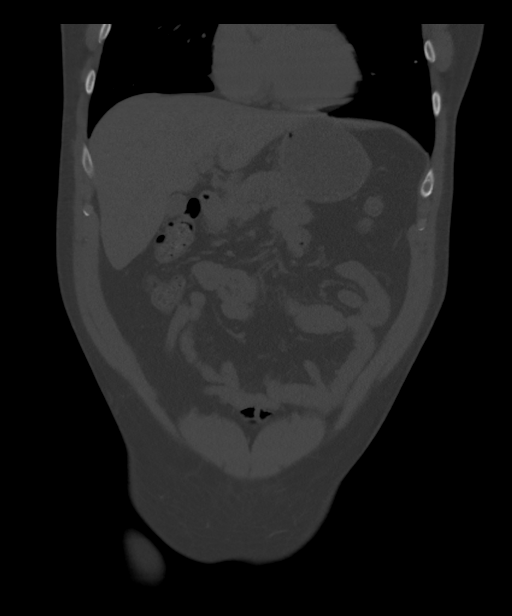
[im 67/101  soft-tissue]
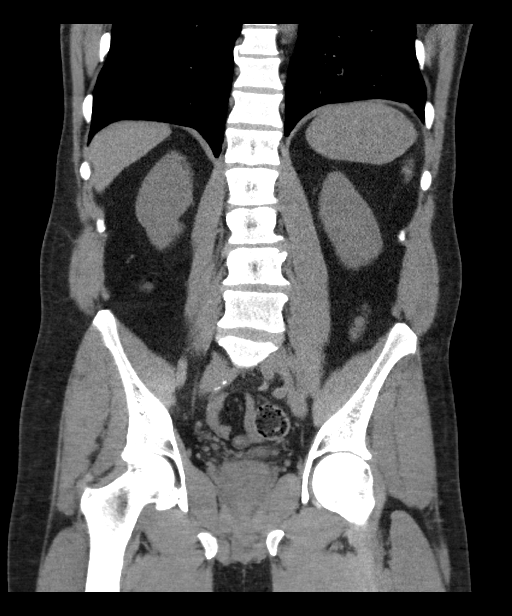

[Series 8: axial post · axial · 0.83mm/px · z∈[+928,+1203]mm · 4 of 93 slices shown]
[im 19/93  soft-tissue]
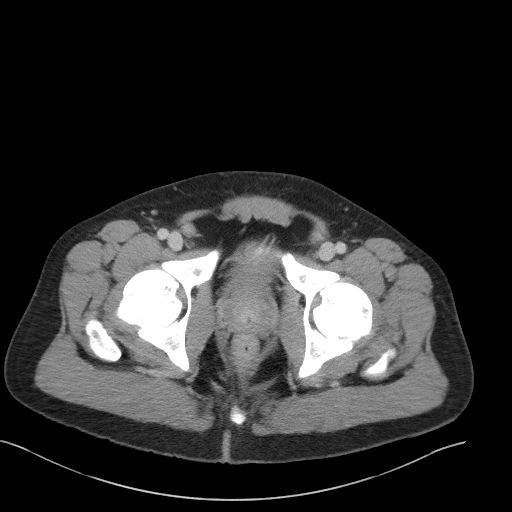
[im 37/93  soft-tissue]
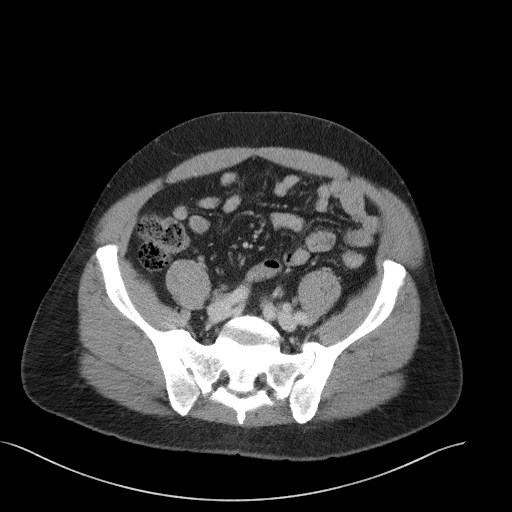
[im 56/93  soft-tissue]
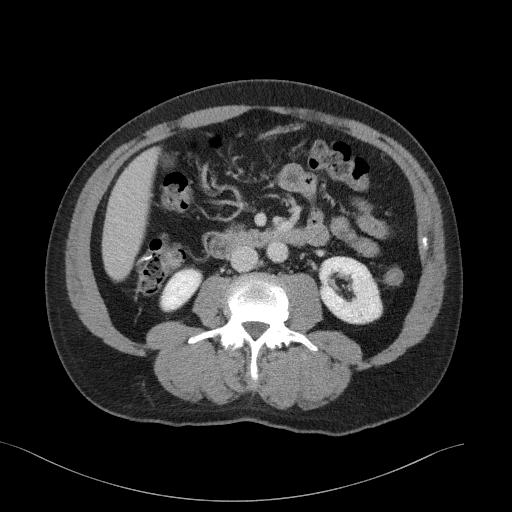
[im 74/93  soft-tissue]
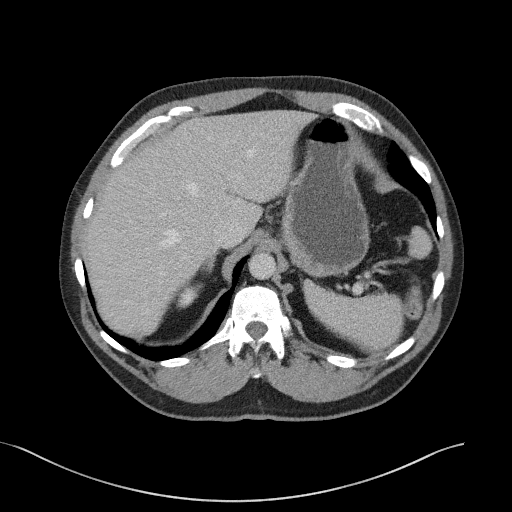

[11 of 46 positions shown; findings below may reference images not displayed]

FINDINGS: Lower chest: 4 mm calcified granuloma in the lingula (series 5/image
16). Mild coronary atherosclerosis in the LAD and right coronary
artery.

Hepatobiliary: 4 mm cyst in the posterior right hepatic lobe (series
8/image 18).

Gallbladder is unremarkable. No intrahepatic or extrahepatic ductal
dilatation.

Pancreas: Within normal limits.

Spleen: Within normal limits.

Adrenals/Urinary Tract: Adrenal glands are within normal limits.

Two punctate nonobstructing left upper pole renal calculi (coronal
image 63).

Two 2 mm nonobstructing right upper pole renal calculi (coronal
images 61 and 63). Additional punctate nonobstructing right lower
pole renal calculus (coronal image 57). No ureteral or bladder
calculi. No hydronephrosis.

Small left renal cysts, measuring up to 14 mm in the posterior
interpolar left kidney (series 8/image 33). No enhancing renal
lesions.

On delayed imaging, there are no filling defects in the bilateral
opacified proximal collecting systems, ureters, or bladder.

Thick-walled bladder, although underdistended.

Stomach/Bowel: Stomach is within normal limits.

No evidence of bowel obstruction.

Normal appendix (series 8/image 56).

Vascular/Lymphatic: No evidence of abdominal aortic aneurysm.

Atherosclerotic calcifications of the abdominal aorta and branch
vessels.

No suspicious abdominopelvic lymphadenopathy.

Reproductive: Prostate is unremarkable.

Other: No abdominopelvic ascites.

Musculoskeletal: Mild degenerative changes of the visualized
thoracolumbar spine.
IMPRESSION: Small bilateral nonobstructing renal calculi measuring up to 2 mm.
No ureteral or bladder calculi. No hydronephrosis.

Mildly thick-walled bladder, although underdistended. In the setting
of hematuria, consider cystoscopy if not already performed.

Additional ancillary findings as above.
